# Patient Record
Sex: Male | Born: 1975 | Race: White | Hispanic: No | Marital: Married | Smoking: Never smoker
Health system: Southern US, Community
[De-identification: ages and names within clinical notes are randomized; demographics above are authoritative.]

## PROBLEM LIST (undated history)

## (undated) DIAGNOSIS — I10 Essential (primary) hypertension: Secondary | ICD-10-CM

## (undated) HISTORY — PX: APPENDECTOMY: SHX54

---

## 2019-11-29 ENCOUNTER — Inpatient Hospital Stay (HOSPITAL_COMMUNITY)
Admission: EM | Admit: 2019-11-29 | Discharge: 2019-12-01 | DRG: 190 | Attending: Internal Medicine | Admitting: Internal Medicine

## 2019-11-29 ENCOUNTER — Emergency Department (HOSPITAL_COMMUNITY)

## 2019-11-29 ENCOUNTER — Encounter (HOSPITAL_COMMUNITY): Payer: Self-pay

## 2019-11-29 ENCOUNTER — Other Ambulatory Visit: Payer: Self-pay

## 2019-11-29 DIAGNOSIS — J9601 Acute respiratory failure with hypoxia: Secondary | ICD-10-CM | POA: Diagnosis present

## 2019-11-29 DIAGNOSIS — M549 Dorsalgia, unspecified: Secondary | ICD-10-CM | POA: Diagnosis present

## 2019-11-29 DIAGNOSIS — Z87891 Personal history of nicotine dependence: Secondary | ICD-10-CM | POA: Diagnosis not present

## 2019-11-29 DIAGNOSIS — R21 Rash and other nonspecific skin eruption: Secondary | ICD-10-CM | POA: Diagnosis present

## 2019-11-29 DIAGNOSIS — J441 Chronic obstructive pulmonary disease with (acute) exacerbation: Principal | ICD-10-CM | POA: Diagnosis present

## 2019-11-29 DIAGNOSIS — R0789 Other chest pain: Secondary | ICD-10-CM | POA: Diagnosis present

## 2019-11-29 DIAGNOSIS — R6510 Systemic inflammatory response syndrome (SIRS) of non-infectious origin without acute organ dysfunction: Secondary | ICD-10-CM | POA: Diagnosis present

## 2019-11-29 DIAGNOSIS — E872 Acidosis: Secondary | ICD-10-CM | POA: Diagnosis present

## 2019-11-29 DIAGNOSIS — Z20822 Contact with and (suspected) exposure to covid-19: Secondary | ICD-10-CM | POA: Diagnosis present

## 2019-11-29 DIAGNOSIS — I10 Essential (primary) hypertension: Secondary | ICD-10-CM | POA: Diagnosis present

## 2019-11-29 DIAGNOSIS — R0902 Hypoxemia: Secondary | ICD-10-CM

## 2019-11-29 DIAGNOSIS — G8929 Other chronic pain: Secondary | ICD-10-CM | POA: Diagnosis present

## 2019-11-29 HISTORY — DX: Essential (primary) hypertension: I10

## 2019-11-29 LAB — COMPREHENSIVE METABOLIC PANEL
ALT: 69 U/L — ABNORMAL HIGH (ref 0–44)
AST: 32 U/L (ref 15–41)
Albumin: 4.4 g/dL (ref 3.5–5.0)
Alkaline Phosphatase: 57 U/L (ref 38–126)
Anion gap: 9 (ref 5–15)
BUN: 15 mg/dL (ref 6–20)
CO2: 21 mmol/L — ABNORMAL LOW (ref 22–32)
Calcium: 9.1 mg/dL (ref 8.9–10.3)
Chloride: 106 mmol/L (ref 98–111)
Creatinine, Ser: 1 mg/dL (ref 0.61–1.24)
GFR calc Af Amer: >60 mL/min (ref >60)
GFR calc non Af Amer: >60 mL/min (ref >60)
Glucose, Bld: 134 mg/dL — ABNORMAL HIGH (ref 70–99)
Potassium: 3.5 mmol/L (ref 3.5–5.1)
Sodium: 136 mmol/L (ref 135–145)
Total Bilirubin: 0.9 mg/dL (ref 0.3–1.2)
Total Protein: 7.8 g/dL (ref 6.5–8.1)

## 2019-11-29 LAB — CBC WITH DIFFERENTIAL/PLATELET
Abs Immature Granulocytes: 0.03 10*3/uL (ref 0.00–0.07)
Basophils Absolute: 0 10*3/uL (ref 0.0–0.1)
Basophils Relative: 0 %
Eosinophils Absolute: 0 10*3/uL (ref 0.0–0.5)
Eosinophils Relative: 0 %
HCT: 45.9 % (ref 39.0–52.0)
Hemoglobin: 16.6 g/dL (ref 13.0–17.0)
Immature Granulocytes: 0 %
Lymphocytes Relative: 5 %
Lymphs Abs: 0.5 10*3/uL — ABNORMAL LOW (ref 0.7–4.0)
MCH: 29.6 pg (ref 26.0–34.0)
MCHC: 36.2 g/dL — ABNORMAL HIGH (ref 30.0–36.0)
MCV: 82 fL (ref 80.0–100.0)
Monocytes Absolute: 0.4 10*3/uL (ref 0.1–1.0)
Monocytes Relative: 4 %
Neutro Abs: 8.8 10*3/uL — ABNORMAL HIGH (ref 1.7–7.7)
Neutrophils Relative %: 91 %
Platelets: 214 10*3/uL (ref 150–400)
RBC: 5.6 MIL/uL (ref 4.22–5.81)
RDW: 11.9 % (ref 11.5–15.5)
WBC: 9.6 10*3/uL (ref 4.0–10.5)
nRBC: 0 % (ref 0.0–0.2)

## 2019-11-29 LAB — LACTIC ACID, PLASMA
Lactic Acid, Venous: 3.4 mmol/L (ref 0.5–1.9)
Lactic Acid, Venous: 2.6 mmol/L (ref 0.5–1.9)
Lactic Acid, Venous: 2.1 mmol/L (ref 0.5–1.9)

## 2019-11-29 LAB — TROPONIN I (HIGH SENSITIVITY)
Troponin I (High Sensitivity): 9 ng/L (ref <18)
Troponin I (High Sensitivity): 8 ng/L (ref <18)

## 2019-11-29 LAB — URINALYSIS, ROUTINE W REFLEX MICROSCOPIC
Bilirubin Urine: NEGATIVE
Glucose, UA: NEGATIVE mg/dL
Hgb urine dipstick: NEGATIVE
Ketones, ur: NEGATIVE mg/dL
Leukocytes,Ua: NEGATIVE
Nitrite: NEGATIVE
Protein, ur: NEGATIVE mg/dL
Specific Gravity, Urine: 1.045 — ABNORMAL HIGH (ref 1.005–1.030)
pH: 5 (ref 5.0–8.0)

## 2019-11-29 LAB — PROCALCITONIN: Procalcitonin: 1.63 ng/mL

## 2019-11-29 LAB — PROTIME-INR
INR: 1 (ref 0.8–1.2)
Prothrombin Time: 12.9 seconds (ref 11.4–15.2)

## 2019-11-29 LAB — MRSA PCR SCREENING: MRSA by PCR: NEGATIVE

## 2019-11-29 LAB — BRAIN NATRIURETIC PEPTIDE: B Natriuretic Peptide: 36 pg/mL (ref 0.0–100.0)

## 2019-11-29 LAB — SARS CORONAVIRUS 2 BY RT PCR (HOSPITAL ORDER, PERFORMED IN ~~LOC~~ HOSPITAL LAB): SARS Coronavirus 2: NEGATIVE

## 2019-11-29 LAB — APTT: aPTT: 27 seconds (ref 24–36)

## 2019-11-29 MED ORDER — ACETAMINOPHEN 325 MG PO TABS
650.0000 mg | ORAL_TABLET | Freq: Once | ORAL | Status: AC
  Administered 2019-11-29: 650 mg via ORAL
  Filled 2019-11-29: qty 2

## 2019-11-29 MED ORDER — SODIUM CHLORIDE 0.9 % IV BOLUS
2000.0000 mL | Freq: Once | INTRAVENOUS | Status: AC
  Administered 2019-11-29: 2000 mL via INTRAVENOUS

## 2019-11-29 MED ORDER — FLUTICASONE FUROATE-VILANTEROL 100-25 MCG/INH IN AEPB
1.0000 | INHALATION_SPRAY | Freq: Every day | RESPIRATORY_TRACT | Status: DC
  Administered 2019-11-30 – 2019-12-01 (×2): 1 via RESPIRATORY_TRACT
  Filled 2019-11-29: qty 28

## 2019-11-29 MED ORDER — VANCOMYCIN HCL 2000 MG/400ML IV SOLN
2000.0000 mg | Freq: Once | INTRAVENOUS | Status: AC
  Administered 2019-11-29: 2000 mg via INTRAVENOUS
  Filled 2019-11-29: qty 400

## 2019-11-29 MED ORDER — SODIUM CHLORIDE 0.9 % IV SOLN
2.0000 g | Freq: Three times a day (TID) | INTRAVENOUS | Status: DC
  Administered 2019-11-29 – 2019-12-01 (×5): 2 g via INTRAVENOUS
  Filled 2019-11-29 (×5): qty 2

## 2019-11-29 MED ORDER — ASPIRIN 81 MG PO CHEW
324.0000 mg | CHEWABLE_TABLET | Freq: Once | ORAL | Status: AC
  Administered 2019-11-29: 324 mg via ORAL
  Filled 2019-11-29: qty 4

## 2019-11-29 MED ORDER — ENOXAPARIN SODIUM 40 MG/0.4ML ~~LOC~~ SOLN
40.0000 mg | SUBCUTANEOUS | Status: DC
  Administered 2019-11-29: 40 mg via SUBCUTANEOUS
  Filled 2019-11-29: qty 0.4

## 2019-11-29 MED ORDER — IOHEXOL 350 MG/ML SOLN
100.0000 mL | Freq: Once | INTRAVENOUS | Status: AC | PRN
  Administered 2019-11-29: 100 mL via INTRAVENOUS

## 2019-11-29 MED ORDER — IPRATROPIUM-ALBUTEROL 0.5-2.5 (3) MG/3ML IN SOLN
3.0000 mL | Freq: Four times a day (QID) | RESPIRATORY_TRACT | Status: DC
  Administered 2019-11-29 – 2019-11-30 (×2): 3 mL via RESPIRATORY_TRACT
  Filled 2019-11-29 (×2): qty 3

## 2019-11-29 MED ORDER — METHYLPREDNISOLONE SODIUM SUCC 125 MG IJ SOLR
60.0000 mg | Freq: Four times a day (QID) | INTRAMUSCULAR | Status: DC
  Administered 2019-11-30 (×2): 60 mg via INTRAVENOUS
  Filled 2019-11-29 (×3): qty 2

## 2019-11-29 MED ORDER — ALBUTEROL SULFATE (2.5 MG/3ML) 0.083% IN NEBU: 2.5000 mg | INHALATION_SOLUTION | RESPIRATORY_TRACT | Status: DC | PRN

## 2019-11-29 MED ORDER — SODIUM CHLORIDE 0.9 % IV BOLUS
1000.0000 mL | Freq: Once | INTRAVENOUS | Status: AC
  Administered 2019-11-29: 1000 mL via INTRAVENOUS

## 2019-11-29 MED ORDER — VANCOMYCIN HCL IN DEXTROSE 1-5 GM/200ML-% IV SOLN: 1000.0000 mg | Freq: Once | INTRAVENOUS | Status: DC

## 2019-11-29 MED ORDER — METRONIDAZOLE IN NACL 5-0.79 MG/ML-% IV SOLN
500.0000 mg | Freq: Once | INTRAVENOUS | Status: AC
  Administered 2019-11-29: 500 mg via INTRAVENOUS
  Filled 2019-11-29: qty 100

## 2019-11-29 MED ORDER — SODIUM CHLORIDE 0.9 % IV SOLN
2.0000 g | Freq: Once | INTRAVENOUS | Status: AC
  Administered 2019-11-29: 2 g via INTRAVENOUS
  Filled 2019-11-29: qty 2

## 2019-11-29 MED ORDER — VANCOMYCIN HCL 1250 MG/250ML IV SOLN
1250.0000 mg | Freq: Two times a day (BID) | INTRAVENOUS | Status: DC
  Administered 2019-11-30: 1250 mg via INTRAVENOUS
  Filled 2019-11-29 (×2): qty 250

## 2019-11-29 MED ORDER — PREDNISONE 20 MG PO TABS: 40.0000 mg | ORAL_TABLET | Freq: Every day | ORAL | Status: DC

## 2019-11-29 MED ORDER — METHYLPREDNISOLONE SODIUM SUCC 125 MG IJ SOLR
125.0000 mg | Freq: Once | INTRAMUSCULAR | Status: AC
  Administered 2019-11-29: 125 mg via INTRAVENOUS
  Filled 2019-11-29: qty 2

## 2019-11-29 NOTE — Progress Notes (Signed)
Pharmacy Antibiotic Note  Lance Jacobson is a 44 y.o. male admitted on 11/29/2019 with sepsis.  Pharmacy has been consulted for vancomycin and cefepime  dosing.  Plan: Start cefepime 2g IV q8h Give  vancomycin 2g  IV x1, then start vancomycin 1.25g IV  q12h Goal vancomycin trough range:      15-20   mcg/mL Pharmacy will continue to monitor renal function, vancomycin troughs as clinically appropriate,  cultures and patient progress.  Height: 5\' 5"  (165.1 cm) Weight: 90.7 kg (200 lb) IBW/kg (Calculated) : 61.5  Temp (24hrs), Avg:102.2 F (39 C), Min:102.2 F (39 C), Max:102.2 F (39 C)  No results for input(s): WBC, CREATININE, LATICACIDVEN, VANCOTROUGH, VANCOPEAK, VANCORANDOM, GENTTROUGH, GENTPEAK, GENTRANDOM, TOBRATROUGH, TOBRAPEAK, TOBRARND, AMIKACINPEAK, AMIKACINTROU, AMIKACIN in the last 168 hours.  CrCl cannot be calculated (No successful lab value found.).    Not on File  Antimicrobials this admission: metronidazole 6/19 >>   cefepime 6/19>>   vancomycin 6/19>>   Microbiology results: 6/19 [facility] x2:  6/19  UCx:   6/19 Resp PCR: SARS CoV-2 : 6/19 MRSA PCR:   Thank you for allowing pharmacy to be a part of this patient's care.  7/19 11/29/2019 2:27 PM

## 2019-11-29 NOTE — H&P (Addendum)
History and Physical  Taylan Marez DQQ:[id number] DOB: 1975-07-03 DOA: 11/29/2019  Referring physician: Ralph Leyden, PA-C, EDP PCP: Patient, No Pcp Per  Outpatient Specialists:   Patient Coming From: Jail  Chief Complaint: Fever, chest pain, SOB  HPI: Lance Jacobson is a 44 y.o. male with a history of Hypertension, not on medication, h/o IV drug use, former smoker (quit about 6 weeks ago).  Used to smoke approximately 1 pack a day.  He presents with sudden onset of fever, shortness of breath, chest pain in his left chest that radiated to his left shoulder and arm.  He has a sensation of needing to cough, but has not been able to cough anything up.  He has had a Covid vaccine and no current Covid cases in the facility where he is staying.  Emergency Department Course: Chest x-ray normal, CTA chest normal.  CT abdomen pelvis unremarkable.  White count normal.  Oxygen saturation 88% on arrival and improved to mid 90s on oxygen.  Patient febrile.  Broad-spectrum antibiotics started.  Review of Systems:   Pt denies any fevers, chills, nausea, vomiting, diarrhea, constipation, abdominal pain, shortness of breath, dyspnea on exertion, orthopnea, cough, wheezing, palpitations, headache, vision changes, lightheadedness, dizziness, melena, rectal bleeding.  Review of systems are otherwise negative  Past Medical History:  Diagnosis Date  . Hypertension    Past Surgical History:  Procedure Laterality Date  . APPENDECTOMY     Social History:  reports that he has never smoked. He has never used smokeless tobacco. He reports that he does not drink alcohol. No history on file for drug use. Patient currently in jail  Not on File  History reviewed. No pertinent family history.    Prior to Admission medications   Not on File    Physical Exam: BP 109/74   Pulse 89   Temp 99.2 F (37.3 C) (Oral)   Resp (!) 25   Ht 5\' 5"  (1.651 m)   Wt 90.7 kg   SpO2 98%   BMI 33.28 kg/m    . General: Middle-age male. Awake and alert and oriented x3. No acute cardiopulmonary distress.  HEENT: Normocephalic atraumatic.  Right and left ears normal in appearance.  Pupils equal, round, reactive to light. Extraocular muscles are intact. Sclerae anicteric and noninjected.  Moist mucosal membranes. No mucosal lesions.  . Neck: Neck supple without lymphadenopathy. No carotid bruits. No masses palpated.  . Cardiovascular: Regular rate with normal S1-S2 sounds. No murmurs, rubs, gallops auscultated. No JVD.  Marland Kitchen Respiratory: Diminished breath sounds with rhonchi and diffuse wheezes.  No accessory muscle use. . Abdomen: Soft, nontender, nondistended. Active bowel sounds. No masses or hepatosplenomegaly  . Skin: Mild heat rash on chest and neck.  No other rashes, lesions, or ulcerations.  Dry, warm to touch. 2+ dorsalis pedis and radial pulses. . Musculoskeletal: No calf or leg pain. All major joints not erythematous nontender.  No upper or lower joint deformation.  Good ROM.  No contractures  . Psychiatric: Intact judgment and insight. Pleasant and cooperative. . Neurologic: No focal neurological deficits. Strength is 5/5 and symmetric in upper and lower extremities.  Cranial nerves II through XII are grossly intact.           Labs on Admission: I have personally reviewed following labs and imaging studies  CBC: Recent Labs  Lab 11/29/19 1449  WBC 9.6  NEUTROABS 8.8*  HGB 16.6  HCT 45.9  MCV 82.0  PLT 214   Basic Metabolic Panel:  Recent Labs  Lab 11/29/19 1449  NA 136  K 3.5  CL 106  CO2 21*  GLUCOSE 134*  BUN 15  CREATININE 1.00  CALCIUM 9.1   GFR: Estimated Creatinine Clearance: 100.7 mL/min (by C-G formula based on SCr of 1 mg/dL). Liver Function Tests: Recent Labs  Lab 11/29/19 1449  AST 32  ALT 69*  ALKPHOS 57  BILITOT 0.9  PROT 7.8  ALBUMIN 4.4   No results for input(s): LIPASE, AMYLASE in the last 168 hours. No results for input(s): AMMONIA in the  last 168 hours. Coagulation Profile: Recent Labs  Lab 11/29/19 1449  INR 1.0   Cardiac Enzymes: No results for input(s): CKTOTAL, CKMB, CKMBINDEX, TROPONINI in the last 168 hours. BNP (last 3 results) No results for input(s): PROBNP in the last 8760 hours. HbA1C: No results for input(s): HGBA1C in the last 72 hours. CBG: No results for input(s): GLUCAP in the last 168 hours. Lipid Profile: No results for input(s): CHOL, HDL, LDLCALC, TRIG, CHOLHDL, LDLDIRECT in the last 72 hours. Thyroid Function Tests: No results for input(s): TSH, T4TOTAL, FREET4, T3FREE, THYROIDAB in the last 72 hours. Anemia Panel: No results for input(s): VITAMINB12, FOLATE, FERRITIN, TIBC, IRON, RETICCTPCT in the last 72 hours. Urine analysis:    Component Value Date/Time   COLORURINE YELLOW 11/29/2019 1419   APPEARANCEUR CLEAR 11/29/2019 1419   LABSPEC 1.045 (H) 11/29/2019 1419   PHURINE 5.0 11/29/2019 1419   GLUCOSEU NEGATIVE 11/29/2019 1419   HGBUR NEGATIVE 11/29/2019 1419   BILIRUBINUR NEGATIVE 11/29/2019 1419   KETONESUR NEGATIVE 11/29/2019 1419   PROTEINUR NEGATIVE 11/29/2019 1419   NITRITE NEGATIVE 11/29/2019 1419   LEUKOCYTESUR NEGATIVE 11/29/2019 1419   Sepsis Labs: @LABRCNTIP (procalcitonin:4,lacticidven:4) ) Recent Results (from the past 240 hour(s))  Blood Culture (routine x 2)     Status: None (Preliminary result)   Collection Time: 11/29/19  2:52 PM   Specimen: BLOOD RIGHT HAND  Result Value Ref Range Status   Specimen Description   Final    BLOOD RIGHT HAND BOTTLES DRAWN AEROBIC AND ANAEROBIC   Special Requests   Final    Blood Culture adequate volume Performed at [facility], [address], [place], [place]    Culture PENDING  Incomplete   Report Status PENDING  Incomplete  Blood Culture (routine x 2)     Status: None (Preliminary result)   Collection Time: 11/29/19  2:55 PM   Specimen: BLOOD LEFT HAND  Result Value Ref Range Status   Specimen Description   Final     BLOOD LEFT HAND BOTTLES DRAWN AEROBIC AND ANAEROBIC   Special Requests   Final    Blood Culture adequate volume Performed at [facility], [address] [place], [place]    Culture PENDING  Incomplete   Report Status PENDING  Incomplete  SARS Coronavirus 2 by RT PCR (hospital order, performed in [facility] hospital lab) Nasopharyngeal Nasopharyngeal Swab     Status: None   Collection Time: 11/29/19  3:41 PM   Specimen: Nasopharyngeal Swab  Result Value Ref Range Status   SARS Coronavirus 2 NEGATIVE NEGATIVE Final    Comment: (NOTE) SARS-CoV-2 target nucleic acids are NOT DETECTED.  The SARS-CoV-2 RNA is generally detectable in upper and lower respiratory specimens during the acute phase of infection. The lowest concentration of SARS-CoV-2 viral copies this assay can detect is 250 copies / mL. A negative result does not preclude SARS-CoV-2 infection and should not be used as the sole basis for  treatment or other patient management decisions.  A negative result may occur with improper specimen collection / handling, submission of specimen other than nasopharyngeal swab, presence of viral mutation(s) within the areas targeted by this assay, and inadequate number of viral copies (<250 copies / mL). A negative result must be combined with clinical observations, patient history, and epidemiological information.  Fact Sheet for Patients:   StrictlyIdeas.no  Fact Sheet for Healthcare Providers: BankingDealers.co.za  This test is not yet approved or  cleared by the [place] FDA and has been authorized for detection and/or diagnosis of SARS-CoV-2 by FDA under an Emergency Use Authorization (EUA).  This EUA will remain in effect (meaning this test can be used) for the duration of the COVID-19 declaration under Section 564(b)(1) of the Act, 21 U.S.C. section 360bbb-3(b)(1), unless the authorization is terminated or revoked  sooner.  Performed at [facility], [address], [place], [place] [address]      Radiological Exams on Admission: CT Angio Chest PE W/Cm &/Or Wo Cm  Result Date: 11/29/2019 CLINICAL DATA:  Shortness of breath and left-sided chest pain. EXAM: CT ANGIOGRAPHY CHEST WITH CONTRAST TECHNIQUE: Multidetector CT imaging of the chest was performed using the standard protocol during bolus administration of intravenous contrast. Multiplanar CT image reconstructions and MIPs were obtained to evaluate the vascular anatomy. CONTRAST:  168mL OMNIPAQUE IOHEXOL 350 MG/ML SOLN COMPARISON:  Chest x-ray dated 11/29/2019 FINDINGS: Cardiovascular: Satisfactory opacification of the pulmonary arteries to the segmental level. No evidence of pulmonary embolism. Normal heart size. No pericardial effusion. Mediastinum/Nodes: No enlarged mediastinal, hilar, or axillary lymph nodes. Thyroid gland, trachea, and esophagus demonstrate no significant findings. Lungs/Pleura: Lungs are clear. No pleural effusion or pneumothorax. Upper Abdomen: No acute abnormality. Musculoskeletal: No chest wall abnormality. No acute or significant osseous findings. Review of the MIP images confirms the above findings. IMPRESSION: Normal exam. Electronically Signed   By: Lorriane Shire M.D.   On: 11/29/2019 16:25   CT ABDOMEN PELVIS W CONTRAST  Result Date: 11/29/2019 CLINICAL DATA:  Abdominal pain and fever. EXAM: CT ABDOMEN AND PELVIS WITH CONTRAST TECHNIQUE: Multidetector CT imaging of the abdomen and pelvis was performed using the standard protocol following bolus administration of intravenous contrast. CONTRAST:  152mL OMNIPAQUE IOHEXOL 350 MG/ML SOLN COMPARISON:  None. FINDINGS: Lower chest: Normal. Hepatobiliary: Hepatic steatosis. Biliary tree is normal including the gallbladder. Pancreas: Unremarkable. No pancreatic ductal dilatation or surrounding inflammatory changes. Spleen: Normal in size without focal abnormality. Adrenals/Urinary Tract:  Adrenal glands are unremarkable. Kidneys are normal, without renal calculi, focal lesion, or hydronephrosis. Bladder is unremarkable. Stomach/Bowel: Stomach is within normal limits. Appendix has been removed. No evidence of bowel wall thickening, distention, or inflammatory changes. Vascular/Lymphatic: No significant vascular findings are present. No enlarged abdominal or pelvic lymph nodes. Reproductive: Prostate is unremarkable. Other: No abdominal wall hernia or abnormality. No abdominopelvic ascites. Musculoskeletal: No acute abnormality. Bilateral pars defects at L5. No spondylolisthesis at this time. Otherwise normal. IMPRESSION: 1. No acute abnormalities of the abdomen or pelvis. 2. Hepatic steatosis. 3. Bilateral pars defects at L5. Electronically Signed   By: Lorriane Shire M.D.   On: 11/29/2019 16:29   DG Chest Port 1 View  Result Date: 11/29/2019 CLINICAL DATA:  Patient went to eat today and came back and started having pain in left arm, left shoulder, and left chest. He also has cramping in left leg. Pt has a history of HTN, Patient was warm to touch and appeared diaphoretic. EXAM: PORTABLE CHEST - 1 VIEW  COMPARISON:  none FINDINGS: Relatively low lung volumes with some crowding bronchovascular structures at the lung bases. No focal airspace consolidation or overt edema. Heart size and mediastinal contours are within normal limits. No effusion.  No pneumothorax. Visualized bones unremarkable. IMPRESSION: Low volumes.  No acute disease. Electronically Signed   By: Corlis Leak M.D.   On: 11/29/2019 14:56    EKG: Independently reviewed.  Sinus tachycardia with abnormal R wave progression.  No acute ST changes  Assessment/Plan: Active Problems:   Acute respiratory failure with hypoxia (HCC)   COPD with acute exacerbation (HCC)   SIRS (systemic inflammatory response syndrome) (HCC)    This patient was discussed with the ED physician, including pertinent vitals, physical exam findings, labs, and  imaging.  We also discussed care given by the ED provider.  1. Acute resp failure with hypoxia a. Oxygen support b. Continuous pulse ox c. No obvious source of infection 2. COPD Antibiotics: Cefepime, Vanc DuoNeb's every 6 scheduled with albuterol every 2 when necessary Continue inhaled steroids and LA bronchodilator Solu-Medrol 60 mg IV every 6 hours Mucinex 3. SIRS a. No bacterial source yet b. Broad spectrum antibiotics c. Lactic acidosis improving d. Procalcitonin elevated - continue antibiotics e. Will narrow with improvement of clinical scenario and dependent upon procalcitonin  DVT prophylaxis: lovenox Consultants: none Code Status: full Family Communication: none  Disposition Plan: return to jail following improvement.   Levie Heritage, DO

## 2019-11-29 NOTE — ED Notes (Signed)
Pt. Attempted to stand to use restroom. Pt. Was dizzy and gait unsteady. Pt. Sat back down in bed and used bedside urinal instead.

## 2019-11-29 NOTE — ED Triage Notes (Signed)
Pt went to eat today and came back and started having pain in left arm, left shoulder, and left chest. He also has cramping in left leg. Pt has a history of HTN, but does not take any medication. VSS with EMS.

## 2019-11-29 NOTE — ED Notes (Signed)
ED TO INPATIENT HANDOFF REPORT  ED Nurse Name and Phone #:   S Name/Age/Gender Lance Jacobson 44 y.o. male Room/Bed: APA06/APA06  Code Status   Code Status: Full Code  Home/SNF/Other [facility] Patient oriented to: self, place, time and situation Is this baseline? Yes   Triage Complete: Triage complete  Chief Complaint Acute respiratory failure with hypoxia (HCC) [J96.01]  Triage Note Pt went to eat today and came back and started having pain in left arm, left shoulder, and left chest. He also has cramping in left leg. Pt has a history of HTN, but does not take any medication. VSS with EMS.     Allergies Not on File  Level of Care/Admitting Diagnosis ED Disposition    ED Disposition Condition Comment   Admit  Hospital Area: [facility] [[id number]]  Level of Care: Med-Surg [16]  Covid Evaluation: Asymptomatic Screening Protocol (No Symptoms)  Admission Type: Emergency [1]  Diagnosis: Acute respiratory failure with hypoxia [facility]) [[id number]]  Admitting Physician: Levie Heritage [[id number]]  Attending Physician: Levie Heritage [[id number]]  Estimated length of stay: 3 - 4 days  Certification:: I certify this patient will need inpatient services for at least 2 midnights       B Medical/Surgery History Past Medical History:  Diagnosis Date  . Hypertension    Past Surgical History:  Procedure Laterality Date  . APPENDECTOMY       A IV Location/Drains/Wounds Patient Lines/Drains/Airways Status    Active Line/Drains/Airways    Name Placement date Placement time Site Days   Peripheral IV 11/29/19 Left Hand 11/29/19  1458  Hand  less than 1   Peripheral IV 11/29/19 Right Hand 11/29/19  1458  Hand  less than 1   Peripheral IV 11/29/19 Right Antecubital 11/29/19  1507  Antecubital  less than 1          Intake/Output Last 24 hours  Intake/Output Summary (Last 24 hours) at 11/29/2019 1940 Last data filed at 11/29/2019 1924 Gross per 24 hour  Intake 3600 ml  Output  --  Net 3600 ml    Labs/Imaging Results for orders placed or performed during the hospital encounter of 11/29/19 (from the past 48 hour(s))  Urinalysis, Routine w reflex microscopic     Status: Abnormal   Collection Time: 11/29/19  2:19 PM  Result Value Ref Range   Color, Urine YELLOW YELLOW   APPearance CLEAR CLEAR   Specific Gravity, Urine 1.045 (H) 1.005 - 1.030   pH 5.0 5.0 - 8.0   Glucose, UA NEGATIVE NEGATIVE mg/dL   Hgb urine dipstick NEGATIVE NEGATIVE   Bilirubin Urine NEGATIVE NEGATIVE   Ketones, ur NEGATIVE NEGATIVE mg/dL   Protein, ur NEGATIVE NEGATIVE mg/dL   Nitrite NEGATIVE NEGATIVE   Leukocytes,Ua NEGATIVE NEGATIVE    Comment: Performed at [facility], [address] [place], [place] [address]  Comprehensive metabolic panel     Status: Abnormal   Collection Time: 11/29/19  2:49 PM  Result Value Ref Range   Sodium 136 135 - 145 mmol/L   Potassium 3.5 3.5 - 5.1 mmol/L   Chloride 106 98 - 111 mmol/L   CO2 21 (L) 22 - 32 mmol/L   Glucose, Bld 134 (H) 70 - 99 mg/dL    Comment: Glucose reference range applies only to samples taken after fasting for at least 8 hours.   BUN 15 6 - 20 mg/dL   Creatinine, Ser 2.13 0.61 - 1.24 mg/dL   Calcium 9.1 8.9 - 08.6 mg/dL  Total Protein 7.8 6.5 - 8.1 g/dL   Albumin 4.4 3.5 - 5.0 g/dL   AST 32 15 - 41 U/L   ALT 69 (H) 0 - 44 U/L   Alkaline Phosphatase 57 38 - 126 U/L   Total Bilirubin 0.9 0.3 - 1.2 mg/dL   GFR calc non Af Amer >60 >60 mL/min   GFR calc Af Amer >60 >60 mL/min   Anion gap 9 5 - 15    Comment: Performed at [facility], [address], [place], [place] [address]  CBC WITH DIFFERENTIAL     Status: Abnormal   Collection Time: 11/29/19  2:49 PM  Result Value Ref Range   WBC 9.6 4.0 - 10.5 K/uL   RBC 5.60 4.22 - 5.81 MIL/uL   Hemoglobin 16.6 13.0 - 17.0 g/dL   HCT 79.0 39 - 52 %   MCV 82.0 80.0 - 100.0 fL   MCH 29.6 26.0 - 34.0 pg   MCHC 36.2 (H) 30.0 - 36.0 g/dL   RDW 24.0 97.3 - 53.2 %   Platelets 214 150  - 400 K/uL   nRBC 0.0 0.0 - 0.2 %   Neutrophils Relative % 91 %   Neutro Abs 8.8 (H) 1.7 - 7.7 K/uL   Lymphocytes Relative 5 %   Lymphs Abs 0.5 (L) 0.7 - 4.0 K/uL   Monocytes Relative 4 %   Monocytes Absolute 0.4 0 - 1 K/uL   Eosinophils Relative 0 %   Eosinophils Absolute 0.0 0 - 0 K/uL   Basophils Relative 0 %   Basophils Absolute 0.0 0 - 0 K/uL   Immature Granulocytes 0 %   Abs Immature Granulocytes 0.03 0.00 - 0.07 K/uL    Comment: Performed at [facility], [address], [place], [place] [address]  APTT     Status: None   Collection Time: 11/29/19  2:49 PM  Result Value Ref Range   aPTT 27 24 - 36 seconds    Comment: Performed at [facility], [address], [place], [place] [address]  Protime-INR     Status: None   Collection Time: 11/29/19  2:49 PM  Result Value Ref Range   Prothrombin Time 12.9 11.4 - 15.2 seconds   INR 1.0 0.8 - 1.2    Comment: (NOTE) INR goal varies based on device and disease states. Performed at [facility], [address] [place], [place] [address]   Procalcitonin     Status: None   Collection Time: 11/29/19  2:49 PM  Result Value Ref Range   Procalcitonin 1.63 ng/mL    Comment:        Interpretation: PCT > 0.5 ng/mL and <= 2 ng/mL: Systemic infection (sepsis) is possible, but other conditions are known to elevate PCT as well. (NOTE)       Sepsis PCT Algorithm           Lower Respiratory Tract                                      Infection PCT Algorithm    ----------------------------     ----------------------------         PCT < 0.25 ng/mL                PCT < 0.10 ng/mL          Strongly encourage  Strongly discourage   discontinuation of antibiotics    initiation of antibiotics    ----------------------------     -----------------------------       PCT 0.25 - 0.50 ng/mL            PCT 0.10 - 0.25 ng/mL               OR       >80% decrease in PCT            Discourage initiation of                                             antibiotics      Encourage discontinuation           of antibiotics    ----------------------------     -----------------------------         PCT >= 0.50 ng/mL              PCT 0.26 - 0.50 ng/mL                AND       <80% decrease in PCT             Encourage initiation of                                             antibiotics       Encourage continuation           of antibiotics    ----------------------------     -----------------------------        PCT >= 0.50 ng/mL                  PCT > 0.50 ng/mL               AND         increase in PCT                  Strongly encourage                                      initiation of antibiotics    Strongly encourage escalation           of antibiotics                                     -----------------------------                                           PCT <= 0.25 ng/mL                                                 OR                                        >  80% decrease in PCT                                      Discontinue / Do not initiate                                             antibiotics  Performed at [facility], [address], [place], [place] [address]   Troponin I (High Sensitivity)     Status: None   Collection Time: 11/29/19  2:49 PM  Result Value Ref Range   Troponin I (High Sensitivity) 9 <18 ng/L    Comment: (NOTE) Elevated high sensitivity troponin I (hsTnI) values and significant  changes across serial measurements may suggest ACS but many other  chronic and acute conditions are known to elevate hsTnI results.  Refer to the "Links" section for chest pain algorithms and additional  guidance. Performed at [facility], [address], [place], [place] [address]   Blood Culture (routine x 2)     Status: None (Preliminary result)   Collection Time: 11/29/19  2:52 PM   Specimen: BLOOD RIGHT HAND  Result Value Ref Range   Specimen Description      BLOOD RIGHT HAND BOTTLES DRAWN AEROBIC AND  ANAEROBIC   Special Requests      Blood Culture adequate volume Performed at [facility], [address], [place], [place] [address]    Culture PENDING    Report Status PENDING   Lactic acid, plasma     Status: Abnormal   Collection Time: 11/29/19  2:54 PM  Result Value Ref Range   Lactic Acid, Venous 3.4 (HH) 0.5 - 1.9 mmol/L    Comment: CRITICAL RESULT CALLED TO, READ BACK BY AND VERIFIED WITH: HOLCULM R. @ 1518 ON [id number] BY HENDERSON L. Performed at [facility], [address], [place], [place] [address]   Blood Culture (routine x 2)     Status: None (Preliminary result)   Collection Time: 11/29/19  2:55 PM   Specimen: BLOOD LEFT HAND  Result Value Ref Range   Specimen Description      BLOOD LEFT HAND BOTTLES DRAWN AEROBIC AND ANAEROBIC   Special Requests      Blood Culture adequate volume Performed at [facility], [address] [place], [place] [address]    Culture PENDING    Report Status PENDING   Brain natriuretic peptide     Status: None   Collection Time: 11/29/19  2:55 PM  Result Value Ref Range   B Natriuretic Peptide 36.0 0.0 - 100.0 pg/mL    Comment: Performed at [facility], [address], [place], [place] [address]  SARS Coronavirus 2 by RT PCR (hospital order, performed in [facility] hospital lab) Nasopharyngeal Nasopharyngeal Swab     Status: None   Collection Time: 11/29/19  3:41 PM   Specimen: Nasopharyngeal Swab  Result Value Ref Range   SARS Coronavirus 2 NEGATIVE NEGATIVE    Comment: (NOTE) SARS-CoV-2 target nucleic acids are NOT DETECTED.  The SARS-CoV-2 RNA is generally detectable in upper and lower respiratory specimens during the acute phase of infection. The lowest concentration of SARS-CoV-2 viral copies this assay can detect is 250 copies / mL. A negative result does not preclude SARS-CoV-2 infection and should not be used as the  sole basis for treatment or other patient management decisions.  A negative result may occur with improper  specimen collection / handling, submission of specimen other than nasopharyngeal swab, presence of viral mutation(s) within the areas targeted by this assay, and inadequate number of viral copies (<250 copies / mL). A negative result must be combined with clinical observations, patient history, and epidemiological information.  Fact Sheet for Patients:   BoilerBrush.com.cy  Fact Sheet for Healthcare Providers: https://pope.com/  This test is not yet approved or  cleared by the [place] FDA and has been authorized for detection and/or diagnosis of SARS-CoV-2 by FDA under an Emergency Use Authorization (EUA).  This EUA will remain in effect (meaning this test can be used) for the duration of the COVID-19 declaration under Section 564(b)(1) of the Act, 21 U.S.C. section 360bbb-3(b)(1), unless the authorization is terminated or revoked sooner.  Performed at [facility], [address] [place], [place] [address]   Lactic acid, plasma     Status: Abnormal   Collection Time: 11/29/19  5:00 PM  Result Value Ref Range   Lactic Acid, Venous 2.6 (HH) 0.5 - 1.9 mmol/L    Comment: CRITICAL VALUE NOTED.  VALUE IS CONSISTENT WITH PREVIOUSLY REPORTED AND CALLED VALUE. Performed at [facility], [address], [place], [place] [address]   Troponin I (High Sensitivity)     Status: None   Collection Time: 11/29/19  5:00 PM  Result Value Ref Range   Troponin I (High Sensitivity) 8 <18 ng/L    Comment: (NOTE) Elevated high sensitivity troponin I (hsTnI) values and significant  changes across serial measurements may suggest ACS but many other  chronic and acute conditions are known to elevate hsTnI results.  Refer to the "Links" section for chest pain algorithms and additional  guidance. Performed at [facility], [address], [place], [place] [address]    CT Angio Chest PE W/Cm &/Or Wo Cm  Result Date: 11/29/2019 CLINICAL DATA:  Shortness  of breath and left-sided chest pain. EXAM: CT ANGIOGRAPHY CHEST WITH CONTRAST TECHNIQUE: Multidetector CT imaging of the chest was performed using the standard protocol during bolus administration of intravenous contrast. Multiplanar CT image reconstructions and MIPs were obtained to evaluate the vascular anatomy. CONTRAST:  OMNIPAQUE IOHEXOL 350 MG/ML SOLN COMPARISON:  Chest x-ray dated 11/29/2019 FINDINGS: Cardiovascular: Satisfactory opacification of the pulmonary arteries to the segmental level. No evidence of pulmonary embolism. Normal heart size. No pericardial effusion. Mediastinum/Nodes: No enlarged mediastinal, hilar, or axillary lymph nodes. Thyroid gland, trachea, and esophagus demonstrate no significant findings. Lungs/Pleura: Lungs are clear. No pleural effusion or pneumothorax. Upper Abdomen: No acute abnormality. Musculoskeletal: No chest wall abnormality. No acute or significant osseous findings. Review of the MIP images confirms the above findings. IMPRESSION: Normal exam. Electronically Signed   By: Francene Boyers M.D.   On: 11/29/2019 16:25   CT ABDOMEN PELVIS W CONTRAST  Result Date: 11/29/2019 CLINICAL DATA:  Abdominal pain and fever. EXAM: CT ABDOMEN AND PELVIS WITH CONTRAST TECHNIQUE: Multidetector CT imaging of the abdomen and pelvis was performed using the standard protocol following bolus administration of intravenous contrast. CONTRAST:  OMNIPAQUE IOHEXOL 350 MG/ML SOLN COMPARISON:  None. FINDINGS: Lower chest: Normal. Hepatobiliary: Hepatic steatosis. Biliary tree is normal including the gallbladder. Pancreas: Unremarkable. No pancreatic ductal dilatation or surrounding inflammatory changes. Spleen: Normal in size without focal abnormality. Adrenals/Urinary Tract: Adrenal glands are unremarkable. Kidneys are normal, without renal calculi, focal lesion, or hydronephrosis. Bladder is unremarkable. Stomach/Bowel: Stomach is  within normal limits. Appendix has been removed. No  evidence of bowel wall thickening, distention, or inflammatory changes. Vascular/Lymphatic: No significant vascular findings are present. No enlarged abdominal or pelvic lymph nodes. Reproductive: Prostate is unremarkable. Other: No abdominal wall hernia or abnormality. No abdominopelvic ascites. Musculoskeletal: No acute abnormality. Bilateral pars defects at L5. No spondylolisthesis at this time. Otherwise normal. IMPRESSION: 1. No acute abnormalities of the abdomen or pelvis. 2. Hepatic steatosis. 3. Bilateral pars defects at L5. Electronically Signed   By: Francene BoyersJames  Maxwell M.D.   On: 11/29/2019 16:29   DG Chest Port 1 View  Result Date: 11/29/2019 CLINICAL DATA:  Patient went to eat today and came back and started having pain in left arm, left shoulder, and left chest. He also has cramping in left leg. Pt has a history of HTN, Patient was warm to touch and appeared diaphoretic. EXAM: PORTABLE CHEST - 1 VIEW COMPARISON:  none FINDINGS: Relatively low lung volumes with some crowding bronchovascular structures at the lung bases. No focal airspace consolidation or overt edema. Heart size and mediastinal contours are within normal limits. No effusion.  No pneumothorax. Visualized bones unremarkable. IMPRESSION: Low volumes.  No acute disease. Electronically Signed   By: Corlis Leak  Hassell M.D.   On: 11/29/2019 14:56    Pending Labs Unresulted Labs (From admission, onward) Comment          Start     Ordered   12/01/19 0500  Basic metabolic panel  Every Mon-Wed-Fri (0500),   R      11/29/19 1423   11/30/19 0500  HIV Antibody (routine testing w rflx)  (HIV Antibody (Routine testing w reflex) panel)  Tomorrow morning,   R        11/29/19 1931   11/30/19 0500  Basic metabolic panel  Tomorrow morning,   R        11/29/19 1931   11/30/19 0500  CBC  Tomorrow morning,   R        11/29/19 1931   11/29/19 2000  Lactic acid, plasma  Once,   STAT        11/29/19 1817   11/29/19 1843  Respiratory Panel by PCR   (Respiratory virus panel with precautions)  Once,   R        11/29/19 1842   11/29/19 1419  Urine culture  ONCE - STAT,   STAT        11/29/19 1421          Vitals/Pain Today's Vitals   11/29/19 1758 11/29/19 1829 11/29/19 1859 11/29/19 1929  BP: 113/74 95/82 109/74 106/64  Pulse: 88 94 89 89  Resp: (!) 29 (!) 25    Temp:      TempSrc:      SpO2: 97% 98% 98% 99%  Weight:      Height:      PainSc:        Isolation Precautions Droplet precaution  Medications Medications  vancomycin (VANCOREADY) IVPB 1250 mg/250 mL (has no administration in time range)  ceFEPIme (MAXIPIME) 2 g in sodium chloride 0.9 % 100 mL IVPB (has no administration in time range)  enoxaparin (LOVENOX) injection 40 mg (has no administration in time range)  methylPREDNISolone sodium succinate (SOLU-MEDROL) 125 mg/2 mL injection 60 mg (has no administration in time range)    Followed by  predniSONE (DELTASONE) tablet 40 mg (has no administration in time range)  fluticasone furoate-vilanterol (BREO ELLIPTA) 100-25 MCG/INH 1 puff (has no administration in time range)  ipratropium-albuterol (DUONEB) 0.5-2.5 (3) MG/3ML nebulizer solution 3 mL (has no administration in time range)  albuterol (PROVENTIL) (2.5 MG/3ML) 0.083% nebulizer solution 2.5 mg (has no administration in time range)  ceFEPIme (MAXIPIME) 2 g in sodium chloride 0.9 % 100 mL IVPB (0 g Intravenous Stopped 11/29/19 1545)  metroNIDAZOLE (FLAGYL) IVPB 500 mg (0 mg Intravenous Stopped 11/29/19 1633)  sodium chloride 0.9 % bolus 1,000 mL (0 mLs Intravenous Stopped 11/29/19 1706)  vancomycin (VANCOREADY) IVPB 2000 mg/400 mL (0 mg Intravenous Stopped 11/29/19 1831)  acetaminophen (TYLENOL) tablet 650 mg (650 mg Oral Given 11/29/19 1330)  aspirin chewable tablet 324 mg (324 mg Oral Given 11/29/19 1501)  sodium chloride 0.9 % bolus 2,000 mL (0 mLs Intravenous Stopped 11/29/19 1924)  iohexol (OMNIPAQUE) 350 MG/ML injection 100 mL (100 mLs Intravenous Contrast  Given 11/29/19 1603)  methylPREDNISolone sodium succinate (SOLU-MEDROL) 125 mg/2 mL injection 125 mg (125 mg Intravenous Given 11/29/19 1933)    Mobility manual wheelchair     Focused Assessments    R Recommendations: See Admitting Provider Note  Report given to:   Additional Notes:

## 2019-11-29 NOTE — ED Provider Notes (Signed)
[address] [facility] EMERGENCY DEPARTMENT Provider Note   CSN: [id number] Arrival date & time: 11/29/19  1326    History Fever   Lance Jacobson is a 44 y.o. male with past medical history significant for hypertension, IV drug use who presents for evaluation of fever.  Patient currently incarcerated.  Patient states after eating earlier today he started having pain in his left arm, left chest.  Feels like he has cramping to his left leg.  Not on any current medications.  Has chronic back pain.  Feels like he needs to cough however is not coughing anything up.  He received his Covid vaccines.  Was feeling well prior to symptoms starting.  Denies headache, lightheadedness, dizziness, abdominal pain, diarrhea, dysuria, lesions.  He does have a rash to his anterior chest which she states he gets when he gets "hot."  No rash to mucous membranes, palms or soles.  No recent tick bites.  Denies additional aggravating or relieving factors.  Per police no known Covid in facility.  No prior history of epidural abscesses, endocarditis.  History obtained from patient and past medical records.  No interpreter is used.  HPI     Past Medical History:  Diagnosis Date  . Hypertension     Patient Active Problem List   Diagnosis Date Noted  . Acute respiratory failure with hypoxia (HCC) 11/29/2019  . COPD with acute exacerbation (HCC) 11/29/2019  . SIRS (systemic inflammatory response syndrome) (HCC) 11/29/2019    History reviewed.   History reviewed. No pertinent family history.  Social History   Tobacco Use  . Smoking status: Never Smoker  . Smokeless tobacco: Never Used  Substance Use Topics  . Alcohol use: Never  . Drug use: Not on file    Home Medications Prior to Admission medications   Not on File    Allergies    Patient has no known allergies.  Review of Systems   Review of Systems  Constitutional: Positive for fatigue and fever.  HENT: Negative.   Respiratory: Positive for cough  and shortness of breath.   Cardiovascular: Positive for chest pain. Negative for palpitations and leg swelling.  Gastrointestinal: Negative.   Genitourinary: Negative.   Musculoskeletal: Positive for back pain (Chronic).  Skin: Positive for rash.  Neurological: Positive for weakness (Generalized). Negative for dizziness, tremors, seizures, syncope, facial asymmetry, speech difficulty, light-headedness, numbness and headaches.  All other systems reviewed and are negative.   Physical Exam Updated Vital Signs BP 107/69 (BP Location: Left Arm)   Pulse 85   Temp 99.1 F (37.3 C) (Oral)   Resp (!) 25   Ht 5\' 5"  (1.651 m)   Wt 90.7 kg   SpO2 100%   BMI 33.28 kg/m   Physical Exam Vitals and nursing note reviewed.  Constitutional:      General: He is not in acute distress.    Appearance: He is well-developed. He is ill-appearing. He is not diaphoretic.  HENT:     Head: Normocephalic and atraumatic.     Jaw: There is normal jaw occlusion.     Nose: Nose normal.     Mouth/Throat:     Lips: Pink.     Mouth: Mucous membranes are dry.     Pharynx: Oropharynx is clear. Uvula midline.     Comments: Dry mucous membranes.  No intraoral lesions Eyes:     Pupils: Pupils are equal, round, and reactive to light.  Neck:     Trachea: Trachea and phonation normal.  Meningeal: Brudzinski's sign and Kernig's sign absent.     Comments: No neck stiffness or neck rigidity.  No meningismus Cardiovascular:     Rate and Rhythm: Regular rhythm. Tachycardia present.     Pulses: Normal pulses.          Radial pulses are 2+ on the right side and 2+ on the left side.       Dorsalis pedis pulses are 2+ on the right side and 2+ on the left side.     Heart sounds: Normal heart sounds.  Pulmonary:     Effort: No respiratory distress.     Breath sounds: Rhonchi (Right lower lobe) present.     Comments: Tachypneic, 2 L via nasal cannula.  Rhonchi to right lower lobe.  Speaks in full sentences without  difficulty Chest:     Comments: Equal rise and fall of chest.  No crepitus or step-offs Abdominal:     General: There is no distension.     Palpations: Abdomen is soft.     Comments: Soft, nontender without rebound or guarding.  Normoactive bowel sounds  Musculoskeletal:        General: Normal range of motion.     Cervical back: Full passive range of motion without pain, normal range of motion and neck supple.     Comments: Moves all 4 extremities without difficulty.  Compartments soft.  Mild tenderness to left calf however no overlying skin changes  Skin:    General: Skin is warm and dry.     Capillary Refill: Capillary refill takes less than 2 seconds.     Comments: No edema, warmth.  No fluctuance duration. Erythematous rash to anterior chest wall.  No purpura, petechiae, rash to palms or soles, bulla, target lesions  Neurological:     General: No focal deficit present.     Mental Status: He is alert.     Cranial Nerves: Cranial nerves are intact.     Sensory: Sensation is intact.     Motor: Motor function is intact.     Coordination: Coordination is intact.     Gait: Gait is intact.     Comments: Cranial nerves II through XII grossly intact.  Moves all 4 extremities at difficulty 5/5 strength to bilateral upper and lower extremities at difficulty     ED Results / Procedures / Treatments   Labs (all labs ordered are listed, but only abnormal results are displayed) Labs Reviewed  LACTIC ACID, PLASMA - Abnormal; Notable for the following components:      Result Value   Lactic Acid, Venous 3.4 (*)    All other components within normal limits  LACTIC ACID, PLASMA - Abnormal; Notable for the following components:   Lactic Acid, Venous 2.6 (*)    All other components within normal limits  COMPREHENSIVE METABOLIC PANEL - Abnormal; Notable for the following components:   CO2 21 (*)    Glucose, Bld 134 (*)    ALT 69 (*)    All other components within normal limits  CBC WITH  DIFFERENTIAL/PLATELET - Abnormal; Notable for the following components:   MCHC 36.2 (*)    Neutro Abs 8.8 (*)    Lymphs Abs 0.5 (*)    All other components within normal limits  URINALYSIS, ROUTINE W REFLEX MICROSCOPIC - Abnormal; Notable for the following components:   Specific Gravity, Urine 1.045 (*)    All other components within normal limits  LACTIC ACID, PLASMA - Abnormal; Notable for the following components:  Lactic Acid, Venous 2.1 (*)    All other components within normal limits  CULTURE, BLOOD (ROUTINE X 2)  CULTURE, BLOOD (ROUTINE X 2)  SARS CORONAVIRUS 2 BY RT PCR (HOSPITAL ORDER, PERFORMED IN [facility])  URINE CULTURE  RESPIRATORY PANEL BY PCR  MRSA PCR SCREENING  APTT  PROTIME-INR  BRAIN NATRIURETIC PEPTIDE  PROCALCITONIN  HIV ANTIBODY (ROUTINE TESTING W REFLEX)  BASIC METABOLIC PANEL  CBC  PROCALCITONIN  TROPONIN I (HIGH SENSITIVITY)  TROPONIN I (HIGH SENSITIVITY)    EKG EKG Interpretation  Date/Time:  Saturday November 29 2019 13:35:36 EDT Ventricular Rate:  116 PR Interval:    QRS Duration: 77 QT Interval:  280 QTC Calculation: 389 R Axis:   22 Text Interpretation: Sinus tachycardia Abnormal R-wave progression, late transition Borderline ST elevation, anterolateral leads No STEMI Confirmed by Alvester Chourifan, Matthew [phone]([id number]) on 11/29/2019 2:33:28 PM   Radiology CT Angio Chest PE W/Cm &/Or Wo Cm  Result Date: 11/29/2019 CLINICAL DATA:  Shortness of breath and left-sided chest pain. EXAM: CT ANGIOGRAPHY CHEST WITH CONTRAST TECHNIQUE: Multidetector CT imaging of the chest was performed using the standard protocol during bolus administration of intravenous contrast. Multiplanar CT image reconstructions and MIPs were obtained to evaluate the vascular anatomy. CONTRAST:  100mL OMNIPAQUE IOHEXOL 350 MG/ML SOLN COMPARISON:  Chest x-ray dated 11/29/2019 FINDINGS: Cardiovascular: Satisfactory opacification of the pulmonary arteries to the segmental level. No  evidence of pulmonary embolism. Normal heart size. No pericardial effusion. Mediastinum/Nodes: No enlarged mediastinal, hilar, or axillary lymph nodes. Thyroid gland, trachea, and esophagus demonstrate no significant findings. Lungs/Pleura: Lungs are clear. No pleural effusion or pneumothorax. Upper Abdomen: No acute abnormality. Musculoskeletal: No chest wall abnormality. No acute or significant osseous findings. Review of the MIP images confirms the above findings. IMPRESSION: Normal exam. Electronically Signed   By: Francene BoyersJames  Maxwell M.D.   On: 11/29/2019 16:25   CT ABDOMEN PELVIS W CONTRAST  Result Date: 11/29/2019 CLINICAL DATA:  Abdominal pain and fever. EXAM: CT ABDOMEN AND PELVIS WITH CONTRAST TECHNIQUE: Multidetector CT imaging of the abdomen and pelvis was performed using the standard protocol following bolus administration of intravenous contrast. CONTRAST:  100mL OMNIPAQUE IOHEXOL 350 MG/ML SOLN COMPARISON:  None. FINDINGS: Lower chest: Normal. Hepatobiliary: Hepatic steatosis. Biliary tree is normal including the gallbladder. Pancreas: Unremarkable. No pancreatic ductal dilatation or surrounding inflammatory changes. Spleen: Normal in size without focal abnormality. Adrenals/Urinary Tract: Adrenal glands are unremarkable. Kidneys are normal, without renal calculi, focal lesion, or hydronephrosis. Bladder is unremarkable. Stomach/Bowel: Stomach is within normal limits. Appendix has been removed. No evidence of bowel wall thickening, distention, or inflammatory changes. Vascular/Lymphatic: No significant vascular findings are present. No enlarged abdominal or pelvic lymph nodes. Reproductive: Prostate is unremarkable. Other: No abdominal wall hernia or abnormality. No abdominopelvic ascites. Musculoskeletal: No acute abnormality. Bilateral pars defects at L5. No spondylolisthesis at this time. Otherwise normal. IMPRESSION: 1. No acute abnormalities of the abdomen or pelvis. 2. Hepatic steatosis. 3.  Bilateral pars defects at L5. Electronically Signed   By: Francene BoyersJames  Maxwell M.D.   On: 11/29/2019 16:29   DG Chest Port 1 View  Result Date: 11/29/2019 CLINICAL DATA:  Patient went to eat today and came back and started having pain in left arm, left shoulder, and left chest. He also has cramping in left leg. Pt has a history of HTN, Patient was warm to touch and appeared diaphoretic. EXAM: PORTABLE CHEST - 1 VIEW COMPARISON:  none FINDINGS: Relatively low lung volumes with some crowding bronchovascular  structures at the lung bases. No focal airspace consolidation or overt edema. Heart size and mediastinal contours are within normal limits. No effusion.  No pneumothorax. Visualized bones unremarkable. IMPRESSION: Low volumes.  No acute disease. Electronically Signed   By: Corlis Leak M.D.   On: 11/29/2019 14:56    Procedures .Critical Care Performed by: Linwood Dibbles, PA-C Authorized by: Linwood Dibbles, PA-C   Critical care provider statement:    Critical care time (minutes):  45   Critical care was necessary to treat or prevent imminent or life-threatening deterioration of the following conditions:  Sepsis   Critical care was time spent personally by me on the following activities:  Discussions with consultants, evaluation of patient's response to treatment, examination of patient, ordering and performing treatments and interventions, ordering and review of laboratory studies, ordering and review of radiographic studies, pulse oximetry, re-evaluation of patient's condition, obtaining history from patient or surrogate and review of old charts   (including critical care time)  Medications Ordered in ED Medications  vancomycin (VANCOREADY) IVPB 1250 mg/250 mL (has no administration in time range)  ceFEPIme (MAXIPIME) 2 g in sodium chloride 0.9 % 100 mL IVPB (has no administration in time range)  enoxaparin (LOVENOX) injection 40 mg (has no administration in time range)  methylPREDNISolone  sodium succinate (SOLU-MEDROL) 125 mg/2 mL injection 60 mg (has no administration in time range)    Followed by  predniSONE (DELTASONE) tablet 40 mg (has no administration in time range)  fluticasone furoate-vilanterol (BREO ELLIPTA) 100-25 MCG/INH 1 puff (has no administration in time range)  ipratropium-albuterol (DUONEB) 0.5-2.5 (3) MG/3ML nebulizer solution 3 mL (has no administration in time range)  albuterol (PROVENTIL) (2.5 MG/3ML) 0.083% nebulizer solution 2.5 mg (has no administration in time range)  ceFEPIme (MAXIPIME) 2 g in sodium chloride 0.9 % 100 mL IVPB (0 g Intravenous Stopped 11/29/19 1545)  metroNIDAZOLE (FLAGYL) IVPB 500 mg (0 mg Intravenous Stopped 11/29/19 1633)  sodium chloride 0.9 % bolus 1,000 mL (0 mLs Intravenous Stopped 11/29/19 1706)  vancomycin (VANCOREADY) IVPB 2000 mg/400 mL (0 mg Intravenous Stopped 11/29/19 1831)  acetaminophen (TYLENOL) tablet 650 mg (650 mg Oral Given 11/29/19 1330)  aspirin chewable tablet 324 mg (324 mg Oral Given 11/29/19 1501)  sodium chloride 0.9 % bolus 2,000 mL (0 mLs Intravenous Stopped 11/29/19 1924)  iohexol (OMNIPAQUE) 350 MG/ML injection 100 mL (100 mLs Intravenous Contrast Given 11/29/19 1603)  methylPREDNISolone sodium succinate (SOLU-MEDROL) 125 mg/2 mL injection 125 mg (125 mg Intravenous Given 11/29/19 1933)    ED Course  I have reviewed the triage vital signs and the nursing notes.  Pertinent labs & imaging results that were available during my care of the patient were reviewed by me and considered in my medical decision making (see chart for details).  44 year old presents for evaluation fever and chest pain.  Patient febrile, tachycardic, tachypneic and hypoxic on arrival.  Admits to cough, left chest pain, left arm pain and cramping pain to his left leg.  Admits to chronic back pain.  History of polysubstance use however has not used in "years."  No clinical evidence of DVT on exam however he is tender to his left calf.  Code  sepsis called.  Patient given broad-spectrum antibiotics and IV fluids.  Did initially have rhonchi to right lower lobe however this resolved after coughing.  Labs and imaging personally viewed and interpreted: Lactic acid 3.4>>2.6 CBC without leukocytosis Metabolic panel with mild hyperglycemia to 134 Procalcitonin 1.63 Urinalysis pending, patient refused  cath urine. Covid negative BNP 36 Troponin 9>>8 CTA chest negative for PE, infectious process CT abdomen without acute process Chest x-ray without acute infiltrates, cardiomegaly, pleural edema, pneumothorax EKG without STEMI  Patient reassessed.  Feels improvement after IV fluids.  No emesis here in the ED.  Reevaluation lungs clear.  He denies any current chest pain or shortness of breath.  Defervesced with Tylenol.  Unfortunately do not have MRI here given history of back pain polysubstance use.  Unknown source of cause of his fever and SIRS criteria.  Will defer additional imaging to hospitalist service.  Patient seen eval by attending, Dr. Langston Masker who agrees with the treatment, plan and disposition.  CONSULT with Dr. Nehemiah Settle with [facility] who will evaluate patient for admission.  The patient appears reasonably stabilized for admission considering the current resources, flow, and capabilities available in the ED at this time, and I doubt any other [facility] requiring further screening and/or treatment in the ED prior to admission.   Clinical Course as of Nov 28 2017  Sat Nov 29, 2019  1500 44 yo male w/ hx of polysubstance abuse ("Years ago") presenting from incarceration with fevers, chills, left arm and shoulder pain.  Reporting onset today.  Also reporting cramping and paresthesias in his left leg.  On arrival patient diaphoretic, tachycardic, tachypneic, and hypoxic requiring 2L [place].  Otherwise fairly well appearing clinically with good mental status.  His neurological exam is BENIGN with no objective signs of neurology weakness for me.   Abdominal exam also benign.  A sepsis w/u was initiated notable for lactate > 3, normal WBC, pending CT chest/abd/pelvis to evaluate for source of infection.  Concern for pulm source with hypoxia.  Covid pending (pt received both vaccines doses).  IV antibiotics BS ordered.  ECG nonischemic and initial trop 9.  UA pending.  PA provider to f/u on CT images, UA, anticipate admission.  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 epidural abscess would remain on the differential.  Patient may require transfer if MRI is not available here, per hospitalist discretion.   [MT]    Clinical Course User Index [MT] Trifan, Carola Rhine, MD   MDM Rules/Calculators/A&P                           Final Clinical Impression(s) / ED Diagnoses Final diagnoses:  SIRS (systemic inflammatory response syndrome) ([place])  Hypoxia    Rx / DC Orders ED Discharge Orders    None       Kyrin Gratz A, PA-C 11/29/19 2019    Wyvonnia Dusky, MD 11/30/19 [phone]

## 2019-11-29 NOTE — ED Notes (Signed)
Attempted report x1. 

## 2019-11-30 LAB — CBC
HCT: 42.8 % (ref 39.0–52.0)
Hemoglobin: 14.9 g/dL (ref 13.0–17.0)
MCH: 29.4 pg (ref 26.0–34.0)
MCHC: 34.8 g/dL (ref 30.0–36.0)
MCV: 84.4 fL (ref 80.0–100.0)
Platelets: 218 10*3/uL (ref 150–400)
RBC: 5.07 MIL/uL (ref 4.22–5.81)
RDW: 12 % (ref 11.5–15.5)
WBC: 13.9 10*3/uL — ABNORMAL HIGH (ref 4.0–10.5)
nRBC: 0 % (ref 0.0–0.2)

## 2019-11-30 LAB — RESPIRATORY PANEL BY PCR

## 2019-11-30 LAB — PROCALCITONIN: Procalcitonin: 7.12 ng/mL

## 2019-11-30 LAB — BASIC METABOLIC PANEL
Anion gap: 13 (ref 5–15)
BUN: 12 mg/dL (ref 6–20)
CO2: 19 mmol/L — ABNORMAL LOW (ref 22–32)
Calcium: 8.9 mg/dL (ref 8.9–10.3)
Chloride: 100 mmol/L (ref 98–111)
Creatinine, Ser: 0.92 mg/dL (ref 0.61–1.24)
GFR calc Af Amer: >60 mL/min (ref >60)
GFR calc non Af Amer: >60 mL/min (ref >60)
Glucose, Bld: 186 mg/dL — ABNORMAL HIGH (ref 70–99)
Potassium: 3.7 mmol/L (ref 3.5–5.1)
Sodium: 132 mmol/L — ABNORMAL LOW (ref 135–145)

## 2019-11-30 LAB — HIV ANTIBODY (ROUTINE TESTING W REFLEX): HIV Screen 4th Generation wRfx: NONREACTIVE

## 2019-11-30 MED ORDER — PREDNISONE 20 MG PO TABS
40.0000 mg | ORAL_TABLET | Freq: Every day | ORAL | Status: DC
  Administered 2019-12-01: 40 mg via ORAL
  Filled 2019-11-30: qty 2

## 2019-11-30 MED ORDER — IPRATROPIUM-ALBUTEROL 0.5-2.5 (3) MG/3ML IN SOLN: 3.0000 mL | Freq: Four times a day (QID) | RESPIRATORY_TRACT | Status: DC | PRN

## 2019-11-30 MED ORDER — POLYETHYLENE GLYCOL 3350 17 G PO PACK
17.0000 g | PACK | Freq: Every day | ORAL | Status: DC
  Administered 2019-12-01: 17 g via ORAL
  Filled 2019-11-30 (×2): qty 1

## 2019-11-30 MED ORDER — ENOXAPARIN SODIUM 40 MG/0.4ML ~~LOC~~ SOLN
40.0000 mg | Freq: Two times a day (BID) | SUBCUTANEOUS | Status: DC
  Administered 2019-11-30 (×2): 40 mg via SUBCUTANEOUS
  Filled 2019-11-30 (×2): qty 0.4

## 2019-11-30 NOTE — Progress Notes (Signed)
PROGRESS NOTE    Lance Jacobson  AGT:[id number] DOB: Apr 24, 1976 DOA: 11/29/2019 PCP: Patient, No Pcp Per   Brief Narrative:  Per HPI: Lance Jacobson is a 44 y.o. male with a history of Hypertension, not on medication, h/o IV drug use, former smoker (quit about 6 weeks ago).  Used to smoke approximately 1 pack a day.  He presents with sudden onset of fever, shortness of breath, chest pain in his left chest that radiated to his left shoulder and arm.  He has a sensation of needing to cough, but has not been able to cough anything up.  He has had a Covid vaccine and no current Covid cases in the facility where he is staying.  Emergency Department Course: Chest x-ray normal, CTA chest normal.  CT abdomen pelvis unremarkable.  White count normal.  Oxygen saturation 88% on arrival and improved to mid 90s on oxygen.  Patient febrile.  Broad-spectrum antibiotics started.HPI:  -Patient continues to have some chest pain, but this is improved since admission.  He remains on 2 L nasal cannula oxygen.  Seen by PT and appears to be able to wean off oxygen and can ambulate near baseline.  Assessment & Plan:   Active Problems:   Acute respiratory failure with hypoxia (HCC)   COPD with acute exacerbation (HCC)   SIRS (systemic inflammatory response syndrome) (HCC)   Left-sided chest pain-improving -Tenderness to palpation of her left upper pectoral and bicep tendon -Suspect that this is related to musculoskeletal causes -Continue to monitor  SIRS criteria with procalcitonin elevation -Continue on cefepime and DC vancomycin since MRSA negative -Patient does have history of IV drug use and may require echocardiogram if blood cultures are positive -Blood cultures pending -Monitor a.m. labs  Acute hypoxemic respiratory failure -Noted to have some lower lung volumes and may be related to atelectasis that may have occurred as a result of chest pain -May also be related to COPD but I do not suspect  acute exacerbation -Plan to keep duo nebs as needed -Taper down to oral steroids for now -Wean as tolerated   DVT prophylaxis: Lovenox Code Status: Full Family Communication: Guarded bedside Disposition Plan: Continue to monitor for any potential infection and discharge once stable. Status is: Inpatient  Remains inpatient appropriate because:Unsafe d/c plan and IV treatments appropriate due to intensity of illness or inability to take PO   Dispo: The patient is from: Home              Anticipated d/c is to: Home              Anticipated d/c date is: 2 days              Patient currently is not medically stable to d/c.  Consultants:   None  Procedures:   See below  Antimicrobials:  Anti-infectives (From admission, onward)   Start     Dose/Rate Route Frequency Ordered Stop   11/30/19 0100  vancomycin (VANCOREADY) IVPB 1250 mg/250 mL  Status:  Discontinued        1,250 mg 166.7 mL/hr over 90 Minutes Intravenous Every 12 hours 11/29/19 1426 11/30/19 1154   11/29/19 2200  ceFEPIme (MAXIPIME) 2 g in sodium chloride 0.9 % 100 mL IVPB     Discontinue     2 g 200 mL/hr over 30 Minutes Intravenous Every 8 hours 11/29/19 1426     11/29/19 1500  vancomycin (VANCOREADY) IVPB 2000 mg/400 mL        2,000 mg  200 mL/hr over 120 Minutes Intravenous  Once 11/29/19 1425 11/29/19 1831   11/29/19 1430  ceFEPIme (MAXIPIME) 2 g in sodium chloride 0.9 % 100 mL IVPB        2 g 200 mL/hr over 30 Minutes Intravenous  Once 11/29/19 1421 11/29/19 1545   11/29/19 1430  metroNIDAZOLE (FLAGYL) IVPB 500 mg        500 mg 100 mL/hr over 60 Minutes Intravenous  Once 11/29/19 1421 11/29/19 1633   11/29/19 1430  vancomycin (VANCOCIN) IVPB 1000 mg/200 mL premix  Status:  Discontinued        1,000 mg 200 mL/hr over 60 Minutes Intravenous  Once 11/29/19 1421 11/29/19 1424       Subjective: Patient seen and evaluated today with no new acute complaints or concerns. No acute concerns or events noted  overnight.  He says his chest pain has improved.  Objective: Vitals:   11/30/19 0009 11/30/19 0418 11/30/19 0823 11/30/19 1018  BP: 133/81 115/74 112/72   Pulse: 87 93 93   Resp: 18 18 18    Temp: 98.7 F (37.1 C) 98.7 F (37.1 C) 98.1 F (36.7 C)   TempSrc: Oral Oral Oral   SpO2: 98% 97% 95% 96%  Weight:      Height:        Intake/Output Summary (Last 24 hours) at 11/30/2019 1153 Last data filed at 11/30/2019 0400 Gross per 24 hour  Intake 4425.43 ml  Output 900 ml  Net 3525.43 ml   Filed Weights   11/29/19 1332 11/29/19 2011  Weight: 90.7 kg 107.2 kg    Examination:  General exam: Appears calm and comfortable  Respiratory system: Clear to auscultation. Respiratory effort normal.  Currently on 2 L nasal cannula oxygen. Cardiovascular system: S1 & S2 heard, RRR. No JVD, murmurs, rubs, gallops or clicks. No pedal edema. Gastrointestinal system: Abdomen is nondistended, soft and nontender. No organomegaly or masses felt. Normal bowel sounds heard. Central nervous system: Alert and oriented. No focal neurological deficits. Extremities: Symmetric 5 x 5 power.  Left chest wall tenderness to palpation. Skin: No rashes, lesions or ulcers Psychiatry: Judgement and insight appear normal. Mood & affect appropriate.     Data Reviewed: I have personally reviewed following labs and imaging studies  CBC: Recent Labs  Lab 11/29/19 1449 11/30/19 0616  WBC 9.6 13.9*  NEUTROABS 8.8*  --   HGB 16.6 14.9  HCT 45.9 42.8  MCV 82.0 84.4  PLT 214 218   Basic Metabolic Panel: Recent Labs  Lab 11/29/19 1449 11/30/19 0616  NA 136 132*  K 3.5 3.7  CL 106 100  CO2 21* 19*  GLUCOSE 134* 186*  BUN 15 12  CREATININE 1.00 0.92  CALCIUM 9.1 8.9   GFR: Estimated Creatinine Clearance: 119.3 mL/min (by C-G formula based on SCr of 0.92 mg/dL). Liver Function Tests: Recent Labs  Lab 11/29/19 1449  AST 32  ALT 69*  ALKPHOS 57  BILITOT 0.9  PROT 7.8  ALBUMIN 4.4   No results  for input(s): LIPASE, AMYLASE in the last 168 hours. No results for input(s): AMMONIA in the last 168 hours. Coagulation Profile: Recent Labs  Lab 11/29/19 1449  INR 1.0   Cardiac Enzymes: No results for input(s): CKTOTAL, CKMB, CKMBINDEX, TROPONINI in the last 168 hours. BNP (last 3 results) No results for input(s): PROBNP in the last 8760 hours. HbA1C: No results for input(s): HGBA1C in the last 72 hours. CBG: No results for input(s): GLUCAP in the last 168  hours. Lipid Profile: No results for input(s): CHOL, HDL, LDLCALC, TRIG, CHOLHDL, LDLDIRECT in the last 72 hours. Thyroid Function Tests: No results for input(s): TSH, T4TOTAL, FREET4, T3FREE, THYROIDAB in the last 72 hours. Anemia Panel: No results for input(s): VITAMINB12, FOLATE, FERRITIN, TIBC, IRON, RETICCTPCT in the last 72 hours. Sepsis Labs: Recent Labs  Lab 11/29/19 1449 11/29/19 1454 11/29/19 1700 11/29/19 1946 11/30/19 0616  PROCALCITON 1.63  --   --   --  7.12  LATICACIDVEN  --  3.4* 2.6* 2.1*  --     Recent Results (from the past 240 hour(s))  Blood Culture (routine x 2)     Status: None (Preliminary result)   Collection Time: 11/29/19  2:52 PM   Specimen: BLOOD RIGHT HAND  Result Value Ref Range Status   Specimen Description   Final    BLOOD RIGHT HAND BOTTLES DRAWN AEROBIC AND ANAEROBIC   Special Requests   Final    Blood Culture adequate volume Performed at [facility], [address] [place], [place] [address]    Culture PENDING  Incomplete   Report Status PENDING  Incomplete  Blood Culture (routine x 2)     Status: None (Preliminary result)   Collection Time: 11/29/19  2:55 PM   Specimen: BLOOD LEFT HAND  Result Value Ref Range Status   Specimen Description   Final    BLOOD LEFT HAND BOTTLES DRAWN AEROBIC AND ANAEROBIC   Special Requests   Final    Blood Culture adequate volume Performed at [facility], [address] [place], [place] [address]    Culture PENDING  Incomplete   Report  Status PENDING  Incomplete  SARS Coronavirus 2 by RT PCR (hospital order, [facility]) Nasopharyngeal Nasopharyngeal Swab     Status: None   Collection Time: 11/29/19  3:41 PM   Specimen: Nasopharyngeal Swab  Result Value Ref Range Status   SARS Coronavirus 2 NEGATIVE NEGATIVE Final    Comment: (NOTE) SARS-CoV-2 target nucleic acids are NOT DETECTED.  The SARS-CoV-2 RNA is generally detectable in upper and lower respiratory specimens during the acute phase of infection. The lowest concentration of SARS-CoV-2 viral copies this assay can detect is 250 copies / mL. A negative result does not preclude SARS-CoV-2 infection and should not be used as the sole basis for treatment or other patient management decisions.  A negative result may occur with improper specimen collection / handling, submission of specimen other than nasopharyngeal swab, presence of viral mutation(s) within the areas targeted by this assay, and inadequate number of viral copies (<250 copies / mL). A negative result must be combined with clinical observations, patient history, and epidemiological information.  Fact Sheet for Patients:   BoilerBrush.com.cyhttps://www.fda.gov/media/136312/download  Fact Sheet for Healthcare Providers: https://pope.com/https://www.fda.gov/media/136313/download  This test is not yet approved or  cleared by the [place] FDA and has been authorized for detection and/or diagnosis of SARS-CoV-2 by FDA under an Emergency Use Authorization (EUA).  This EUA will remain in effect (meaning this test can be used) for the duration of the COVID-19 declaration under Section 564(b)(1) of the Act, 21 U.S.C. section 360bbb-3(b)(1), unless the authorization is terminated or revoked sooner.  Performed at [facility], [address] [place], [place] [address]   MRSA PCR Screening     Status: None   Collection Time: 11/29/19 10:10 PM   Specimen: Nasal Mucosa; Nasopharyngeal  Result Value Ref Range Status    MRSA by PCR NEGATIVE NEGATIVE Final  Comment:        The GeneXpert MRSA Assay (FDA approved for NASAL specimens only), is one component of a comprehensive MRSA colonization surveillance program. It is not intended to diagnose MRSA infection nor to guide or monitor treatment for MRSA infections. Performed at [facility], [address], [place], [place] [address]          Radiology Studies: CT Angio Chest PE W/Cm &/Or Wo Cm  Result Date: 11/29/2019 CLINICAL DATA:  Shortness of breath and left-sided chest pain. EXAM: CT ANGIOGRAPHY CHEST WITH CONTRAST TECHNIQUE: Multidetector CT imaging of the chest was performed using the standard protocol during bolus administration of intravenous contrast. Multiplanar CT image reconstructions and MIPs were obtained to evaluate the vascular anatomy. CONTRAST:  OMNIPAQUE IOHEXOL 350 MG/ML SOLN COMPARISON:  Chest x-ray dated 11/29/2019 FINDINGS: Cardiovascular: Satisfactory opacification of the pulmonary arteries to the segmental level. No evidence of pulmonary embolism. Normal heart size. No pericardial effusion. Mediastinum/Nodes: No enlarged mediastinal, hilar, or axillary lymph nodes. Thyroid gland, trachea, and esophagus demonstrate no significant findings. Lungs/Pleura: Lungs are clear. No pleural effusion or pneumothorax. Upper Abdomen: No acute abnormality. Musculoskeletal: No chest wall abnormality. No acute or significant osseous findings. Review of the MIP images confirms the above findings. IMPRESSION: Normal exam. Electronically Signed   By: Francene Boyers M.D.   On: 11/29/2019 16:25   CT ABDOMEN PELVIS W CONTRAST  Result Date: 11/29/2019 CLINICAL DATA:  Abdominal pain and fever. EXAM: CT ABDOMEN AND PELVIS WITH CONTRAST TECHNIQUE: Multidetector CT imaging of the abdomen and pelvis was performed using the standard protocol following bolus administration of intravenous contrast. CONTRAST:  OMNIPAQUE IOHEXOL 350 MG/ML SOLN  COMPARISON:  None. FINDINGS: Lower chest: Normal. Hepatobiliary: Hepatic steatosis. Biliary tree is normal including the gallbladder. Pancreas: Unremarkable. No pancreatic ductal dilatation or surrounding inflammatory changes. Spleen: Normal in size without focal abnormality. Adrenals/Urinary Tract: Adrenal glands are unremarkable. Kidneys are normal, without renal calculi, focal lesion, or hydronephrosis. Bladder is unremarkable. Stomach/Bowel: Stomach is within normal limits. Appendix has been removed. No evidence of bowel wall thickening, distention, or inflammatory changes. Vascular/Lymphatic: No significant vascular findings are present. No enlarged abdominal or pelvic lymph nodes. Reproductive: Prostate is unremarkable. Other: No abdominal wall hernia or abnormality. No abdominopelvic ascites. Musculoskeletal: No acute abnormality. Bilateral pars defects at L5. No spondylolisthesis at this time. Otherwise normal. IMPRESSION: 1. No acute abnormalities of the abdomen or pelvis. 2. Hepatic steatosis. 3. Bilateral pars defects at L5. Electronically Signed   By: Francene Boyers M.D.   On: 11/29/2019 16:29   DG Chest Port 1 View  Result Date: 11/29/2019 CLINICAL DATA:  Patient went to eat today and came back and started having pain in left arm, left shoulder, and left chest. He also has cramping in left leg. Pt has a history of HTN, Patient was warm to touch and appeared diaphoretic. EXAM: PORTABLE CHEST - 1 VIEW COMPARISON:  none FINDINGS: Relatively low lung volumes with some crowding bronchovascular structures at the lung bases. No focal airspace consolidation or overt edema. Heart size and mediastinal contours are within normal limits. No effusion.  No pneumothorax. Visualized bones unremarkable. IMPRESSION: Low volumes.  No acute disease. Electronically Signed   By: Corlis Leak M.D.   On: 11/29/2019 14:56        Scheduled Meds: . enoxaparin (LOVENOX) injection  40 mg Subcutaneous Q24H  . fluticasone  furoate-vilanterol  1 puff Inhalation Daily  . ipratropium-albuterol  3 mL Nebulization Q6H  . methylPREDNISolone (  SOLU-MEDROL) injection  60 mg Intravenous Q6H   Followed by  . [START ON 12/01/2019] predniSONE  40 mg Oral Q breakfast  . polyethylene glycol  17 g Oral Daily   Continuous Infusions: . ceFEPime (MAXIPIME) IV 2 g (11/30/19 9371)  . vancomycin 1,250 mg (11/30/19 0004)     LOS: 1 day    Time spent: 35 minutes    Jazmene Racz D Sherryll Burger, DO [facility]  If 7PM-7AM, please contact night-coverage [email or web address] 11/30/2019, 11:53 AM

## 2019-11-30 NOTE — Plan of Care (Signed)
  Problem: Acute Rehab PT Goals(only PT should resolve) Goal: Pt Will Go Supine/Side To Sit Outcome: Progressing Flowsheets (Taken 11/30/2019 1135) Pt will go Supine/Side to Sit:  Independently  with modified independence Goal: Patient Will Transfer Sit To/From Stand Outcome: Progressing Flowsheets (Taken 11/30/2019 1135) Patient will transfer sit to/from stand: with modified independence Goal: Pt Will Transfer Bed To Chair/Chair To Bed Outcome: Progressing Flowsheets (Taken 11/30/2019 1135) Pt will Transfer Bed to Chair/Chair to Bed: with modified independence Goal: Pt Will Ambulate Outcome: Progressing Flowsheets (Taken 11/30/2019 1135) Pt will Ambulate:  > 125 feet  with supervision  with modified independence   11:35 AM, 11/30/19 Ocie Bob, MPT Physical Therapist with [facility] 336 [phone] office [phone] mobile phone

## 2019-11-30 NOTE — Evaluation (Signed)
Physical Therapy Evaluation Patient Details Name: Lance Jacobson MRN: [id number] DOB: 1975/06/27 Today's Date: 11/30/2019   History of Present Illness  Lance Jacobson is a 44 y.o. male with a history of Hypertension, not on medication, h/o IV drug use, former smoker (quit about 6 weeks ago).  Used to smoke approximately 1 pack a day.  He presents with sudden onset of fever, shortness of breath, chest pain in his left chest that radiated to his left shoulder and arm.  He has a sensation of needing to cough, but has not been able to cough anything up.  He has had a Covid vaccine and no current Covid cases in the facility where he is staying.    Clinical Impression  Patient functioning near baseline for functional mobility and gait, c/o left leg pain and has tendency to lean on side rails in hallway for support during ambulation, occasional standing rest breaks, no loss of balance, on room air with SpO2 at 97% - RN notified.  Patient tolerated sitting up at bedside after therapy with prison guard in room.  Patient will benefit from continued physical therapy in hospital and recommended venue below to increase strength, balance, endurance for safe ADLs and gait.     Follow Up Recommendations Home health PT;Supervision for mobility/OOB;Supervision - Intermittent    Equipment Recommendations  None recommended by PT    Recommendations for Other Services       Precautions / Restrictions Precautions Precautions: Fall Restrictions Weight Bearing Restrictions: No      Mobility  Bed Mobility Overal bed mobility: Modified Independent                Transfers Overall transfer level: Needs assistance Equipment used: None;1 person hand held assist Transfers: Sit to/from Stand;Stand Pivot Transfers Sit to Stand: Supervision Stand pivot transfers: Supervision          Ambulation/Gait Ambulation/Gait assistance: Min guard Gait Distance (Feet): 80 Feet Assistive device: None Gait  Pattern/deviations: Decreased step length - left;Decreased stance time - left;Decreased stride length;Antalgic Gait velocity: decreased   General Gait Details: labored cadence with frequent use of side rail for support, no loss of balance, occasional standing rest breaks, on room air with SpO2 at 97%  Stairs            Wheelchair Mobility    Modified Rankin (Stroke Patients Only)       Balance Overall balance assessment: Mild deficits observed, not formally tested                                           Pertinent Vitals/Pain Pain Assessment: Faces Faces Pain Scale: Hurts little more Pain Location: LLE Pain Descriptors / Indicators: Sore;Guarding Pain Intervention(s): Limited activity within patient's tolerance;Monitored during session    Home Living Family/patient expects to be discharged to:: Other (Comment) (presently incarcerated)                      Prior Function Level of Independence: Independent         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     Hand Dominance        Extremity/Trunk Assessment   Upper Extremity Assessment Upper Extremity Assessment: Overall WFL for tasks assessed    Lower Extremity Assessment Lower Extremity Assessment: Generalized weakness;RLE deficits/detail;LLE deficits/detail RLE Deficits / Details: grossly 5/5 RLE Sensation: WNL RLE Coordination: WNL LLE Deficits /  Details: grossly 4/5 LLE Sensation: WNL LLE Coordination: WNL    Cervical / Trunk Assessment Cervical / Trunk Assessment: Normal  Communication   Communication: No difficulties  Cognition Arousal/Alertness: Awake/alert Behavior During Therapy: WFL for tasks assessed/performed Overall Cognitive Status: Within Functional Limits for tasks assessed                                        General Comments      Exercises     Assessment/Plan    PT Assessment Patient needs continued PT services  PT Problem  List Decreased strength;Decreased activity tolerance;Decreased balance;Decreased mobility       PT Treatment Interventions Balance training;Gait training;Stair training;Functional mobility training;Therapeutic activities;Therapeutic exercise;Patient/family education    PT Goals (Current goals can be found in the Care Plan section)  Acute Rehab PT Goals Patient Stated Goal: walk without pain left leg PT Goal Formulation: With patient Time For Goal Achievement: 12/04/19 Potential to Achieve Goals: Good    Frequency Min 3X/week   Barriers to discharge        Co-evaluation               AM-PAC PT "6 Clicks" Mobility  Outcome Measure Help needed turning from your back to your side while in a flat bed without using bedrails?: None Help needed moving from lying on your back to sitting on the side of a flat bed without using bedrails?: None Help needed moving to and from a bed to a chair (including a wheelchair)?: A Little Help needed standing up from a chair using your arms (e.g., wheelchair or bedside chair)?: A Little Help needed to walk in hospital room?: A Little Help needed climbing 3-5 steps with a railing? : A Little 6 Click Score: 20    End of Session   Activity Tolerance: Patient tolerated treatment well;Patient limited by fatigue Patient left: in bed;with call bell/phone within reach;with nursing/sitter in room;Other (comment) (left sitting up at bedside with prison guard in room) Nurse Communication: Mobility status PT Visit Diagnosis: Unsteadiness on feet (R26.81);Other abnormalities of gait and mobility (R26.89);Muscle weakness (generalized) (M62.81)    Time: 2979-8921 PT Time Calculation (min) (ACUTE ONLY): 22 min   Charges:   PT Evaluation $PT Eval Moderate Complexity: 1 Mod PT Treatments $Therapeutic Activity: 23-37 mins        11:33 AM, 11/30/19 Ocie Bob, MPT Physical Therapist with [facility] [phone] office [phone]  mobile phone

## 2019-11-30 NOTE — Progress Notes (Signed)
Dressing to abdomen changed. PRN Dilaudid given prior, tolerated with minimal discomfort.

## 2019-12-01 LAB — URINE CULTURE: Culture: NO GROWTH

## 2019-12-01 LAB — BASIC METABOLIC PANEL
Anion gap: 8 (ref 5–15)
BUN: 16 mg/dL (ref 6–20)
CO2: 23 mmol/L (ref 22–32)
Calcium: 8.7 mg/dL — ABNORMAL LOW (ref 8.9–10.3)
Chloride: 104 mmol/L (ref 98–111)
Creatinine, Ser: 0.83 mg/dL (ref 0.61–1.24)
GFR calc Af Amer: >60 mL/min (ref >60)
GFR calc non Af Amer: >60 mL/min (ref >60)
Glucose, Bld: 167 mg/dL — ABNORMAL HIGH (ref 70–99)
Potassium: 3.6 mmol/L (ref 3.5–5.1)
Sodium: 135 mmol/L (ref 135–145)

## 2019-12-01 LAB — CBC
HCT: 40 % (ref 39.0–52.0)
Hemoglobin: 14.2 g/dL (ref 13.0–17.0)
MCH: 30.2 pg (ref 26.0–34.0)
MCHC: 35.5 g/dL (ref 30.0–36.0)
MCV: 85.1 fL (ref 80.0–100.0)
Platelets: 236 10*3/uL (ref 150–400)
RBC: 4.7 MIL/uL (ref 4.22–5.81)
RDW: 12.5 % (ref 11.5–15.5)
WBC: 11.7 10*3/uL — ABNORMAL HIGH (ref 4.0–10.5)
nRBC: 0 % (ref 0.0–0.2)

## 2019-12-01 LAB — PROCALCITONIN: Procalcitonin: 4.54 ng/mL

## 2019-12-01 LAB — LACTIC ACID, PLASMA: Lactic Acid, Venous: 1.7 mmol/L (ref 0.5–1.9)

## 2019-12-01 MED ORDER — AMOXICILLIN-POT CLAVULANATE 875-125 MG PO TABS
1.0000 | ORAL_TABLET | Freq: Two times a day (BID) | ORAL | 0 refills | Status: AC
Start: 2019-12-01 — End: 2019-12-06

## 2019-12-01 MED ORDER — ALBUTEROL SULFATE HFA 108 (90 BASE) MCG/ACT IN AERS
2.0000 | INHALATION_SPRAY | Freq: Four times a day (QID) | RESPIRATORY_TRACT | 2 refills | Status: AC | PRN
Start: 2019-12-01 — End: ?

## 2019-12-01 NOTE — Progress Notes (Signed)
Initial Nutrition Assessment  DOCUMENTATION CODES:   Obesity unspecified  INTERVENTION:  Recommend CHO modified/Heart Healthy diet at discharge   NUTRITION DIAGNOSIS:   Inadequate oral intake related to acute illness (SIRS, COPD exacerbation) as evidenced by estimated needs.   GOAL:  Patient will meet greater than or equal to 90% of their needs   MONITOR:  PO intake, Labs, I & O's, Weight trends, Skin  REASON FOR ASSESSMENT:   Consult Assessment of nutrition requirement/status   ASSESSMENT: Patient is a 44 yo male who presents with fever, shortness of breath and chest pain. History of IV drug use, HTN. No acute findings Chest x-ray, CT abdomen/pelvis. COPD exacerbation, SIRS and acute hypoxemic respiratory failure.   Regular diet with intake at 100%. Patient denies any difficulty with consuming meals. Patient is being discharged as assessment is completed. He is returning to facility where his meals are prepared for him.  Labs reviewed: Glucose 167, WBC-11.7   Medications reviewed and include: Miralax, Prednisone   Patient reports usual weight 190-200 lb. Limited weight history available. Currently 107.2 kg (235.8 lb).   NUTRITION - FOCUSED PHYSICAL EXAM:    Most Recent Value  Orbital Region No depletion  Upper Arm Region No depletion  Thoracic and Lumbar Region No depletion  Buccal Region No depletion  Temple Region No depletion  Clavicle and Acromion Bone Region No depletion  Dorsal Hand No depletion  Edema (RD Assessment) None  Hair Unable to assess  Eyes Reviewed  Mouth Reviewed  Skin Reviewed      Diet Order:   Diet Order            Diet - low sodium heart healthy           Diet regular Room service appropriate? Yes; Fluid consistency: Thin  Diet effective now                 EDUCATION NEEDS:   No education needs have been identified at this time Skin:  Skin Assessment: Reviewed RN Assessment  Last BM:  unknown- distended abdomen  Height:    Ht Readings from Last 1 Encounters:  11/29/19 5\' 5"  (1.651 m)    Weight:   Wt Readings from Last 1 Encounters:  11/29/19 107.2 kg    Ideal Body Weight:   62 kg  BMI:  Body mass index is 39.33 kg/m.  Estimated Nutritional Needs:   Kcal:  12/01/19  Protein:  124-136  Fluid:  >2.3 liters daily   [id number] MS,RD,CSG,LDN Pager: available in [place]

## 2019-12-01 NOTE — Discharge Summary (Addendum)
Physician Discharge Summary  Lance Jacobson KNL:[id number] DOB: 02/21/1976 DOA: 11/29/2019  PCP: Patient, No Pcp Per  Admit date: 11/29/2019  Discharge date: 12/01/2019  [facility]  [facility]  [facility]  Recommendations for Outpatient Follow-up:  1. Follow up with PCP in 1-2 weeks 2. Please obtain BMP/CBC in one week 3. Remain on Augmentin as prescribed for 5 more days to complete course of treatment empirically 4. No growth on blood or urine cultures.  Respiratory panel negative.  Covid testing negative. 5. Albuterol inhaler given as needed for shortness of breath or wheezing  Home Health: Yes with PT  Equipment/Devices: None  Discharge Condition: Stable  CODE STATUS: Full  Diet recommendation: Heart Healthy  Brief/Interim Summary: Per HPI: Lance Crawfordis a 44 y.o.malewith a history of Hypertension, not on medication, h/o IV drug use, former smoker (quitabout 6 weeks ago).Used to smoke approximately 1 pack a day. He presents with sudden onset of fever, shortness of breath, chest pain in his left chest that radiated to his left shoulder and arm. He has a sensation of needing to cough, but has not been able to cough anything up.He has had a Covid vaccine and no current Covid cases in the facility where he is staying.  Emergency Department Course: Chest x-ray normal, CTA chest normal. CT abdomen pelvis unremarkable. White count normal. Oxygen saturation 88% on arrival and improved to mid 90s on oxygen. Patient febrile. Broad-spectrum antibiotics started.  -Patient was admitted with SIRS criteria and procalcitonin elevation, but not clear sepsis given the fact that he did not have any identifiable source of infection.  He was placed on vancomycin and cefepime empirically on admission and was then simply kept on cefepime as MRSA PCR was negative.  He has had no growth in his blood cultures or urine cultures and no identifiable source of infection.  He did have  elevation of his procalcitonin levels which are now downtrending and he appears to be feeling overall much better with left chest wall pain that has essentially resolved.  He has no further shortness of breath and was initially treated with for some possible COPD exacerbation.  He will be discharged on Augmentin for 5 more days to complete a total 7-day course of treatment empirically.  He will be given albuterol inhaler as needed.  He has no wheezing and therefore does not appear to need any steroids on discharge.  He should have repeat lab work done as ordered in 1 week.  His respiratory panel as well as Covid testing were both negative.  He has also been seen by physical therapy due to some gait instability with home health PT recommended and this has been ordered.  Discharge Diagnoses:  Active Problems:   Acute respiratory failure with hypoxia (HCC)   COPD with acute exacerbation (HCC)   SIRS (systemic inflammatory response syndrome) (HCC)    Discharge Instructions  Discharge Instructions    Diet - low sodium heart healthy   Complete by: As directed    Increase activity slowly   Complete by: As directed      Allergies as of 12/01/2019   No Known Allergies     Medication List    TAKE these medications   albuterol 108 (90 Base) MCG/ACT inhaler Commonly known as: VENTOLIN HFA Inhale 2 puffs into the lungs every 6 (six) hours as needed for wheezing or shortness of breath.   amoxicillin-clavulanate 875-125 MG tablet Commonly known as: Augmentin Take 1 tablet by mouth 2 (two) times daily for 5  days.       Follow-up Information    pcp Follow up in 1 week(s).              No Known Allergies  Consultations:  None   Procedures/Studies: CT Angio Chest PE W/Cm &/Or Wo Cm  Result Date: 11/29/2019 CLINICAL DATA:  Shortness of breath and left-sided chest pain. EXAM: CT ANGIOGRAPHY CHEST WITH CONTRAST TECHNIQUE: Multidetector CT imaging of the chest was performed using the  standard protocol during bolus administration of intravenous contrast. Multiplanar CT image reconstructions and MIPs were obtained to evaluate the vascular anatomy. CONTRAST:  OMNIPAQUE IOHEXOL 350 MG/ML SOLN COMPARISON:  Chest x-ray dated 11/29/2019 FINDINGS: Cardiovascular: Satisfactory opacification of the pulmonary arteries to the segmental level. No evidence of pulmonary embolism. Normal heart size. No pericardial effusion. Mediastinum/Nodes: No enlarged mediastinal, hilar, or axillary lymph nodes. Thyroid gland, trachea, and esophagus demonstrate no significant findings. Lungs/Pleura: Lungs are clear. No pleural effusion or pneumothorax. Upper Abdomen: No acute abnormality. Musculoskeletal: No chest wall abnormality. No acute or significant osseous findings. Review of the MIP images confirms the above findings. IMPRESSION: Normal exam. Electronically Signed   By: Francene Boyers M.D.   On: 11/29/2019 16:25   CT ABDOMEN PELVIS W CONTRAST  Result Date: 11/29/2019 CLINICAL DATA:  Abdominal pain and fever. EXAM: CT ABDOMEN AND PELVIS WITH CONTRAST TECHNIQUE: Multidetector CT imaging of the abdomen and pelvis was performed using the standard protocol following bolus administration of intravenous contrast. CONTRAST:  OMNIPAQUE IOHEXOL 350 MG/ML SOLN COMPARISON:  None. FINDINGS: Lower chest: Normal. Hepatobiliary: Hepatic steatosis. Biliary tree is normal including the gallbladder. Pancreas: Unremarkable. No pancreatic ductal dilatation or surrounding inflammatory changes. Spleen: Normal in size without focal abnormality. Adrenals/Urinary Tract: Adrenal glands are unremarkable. Kidneys are normal, without renal calculi, focal lesion, or hydronephrosis. Bladder is unremarkable. Stomach/Bowel: Stomach is within normal limits. Appendix has been removed. No evidence of bowel wall thickening, distention, or inflammatory changes. Vascular/Lymphatic: No significant vascular findings are present. No enlarged  abdominal or pelvic lymph nodes. Reproductive: Prostate is unremarkable. Other: No abdominal wall hernia or abnormality. No abdominopelvic ascites. Musculoskeletal: No acute abnormality. Bilateral pars defects at L5. No spondylolisthesis at this time. Otherwise normal. IMPRESSION: 1. No acute abnormalities of the abdomen or pelvis. 2. Hepatic steatosis. 3. Bilateral pars defects at L5. Electronically Signed   By: Francene Boyers M.D.   On: 11/29/2019 16:29   DG Chest Port 1 View  Result Date: 11/29/2019 CLINICAL DATA:  Patient went to eat today and came back and started having pain in left arm, left shoulder, and left chest. He also has cramping in left leg. Pt has a history of HTN, Patient was warm to touch and appeared diaphoretic. EXAM: PORTABLE CHEST - 1 VIEW COMPARISON:  none FINDINGS: Relatively low lung volumes with some crowding bronchovascular structures at the lung bases. No focal airspace consolidation or overt edema. Heart size and mediastinal contours are within normal limits. No effusion.  No pneumothorax. Visualized bones unremarkable. IMPRESSION: Low volumes.  No acute disease. Electronically Signed   By: Corlis Leak M.D.   On: 11/29/2019 14:56     Discharge Exam: Vitals:   12/01/19 0750 12/01/19 1100  BP:    Pulse:    Resp:    Temp:    SpO2: 98% 99%   Vitals:   11/30/19 2058 12/01/19 0600 12/01/19 0750 12/01/19 1100  BP: (!) 104/59 118/78    Pulse: 94 65    Resp: 18 18  Temp: 98.5 F (36.9 C) 97.8 F (36.6 C)    TempSrc: Oral Oral    SpO2: 98% 99% 98% 99%  Weight:      Height:        General: Pt is alert, awake, not in acute distress Cardiovascular: RRR, S1/S2 +, no rubs, no gallops Respiratory: CTA bilaterally, no wheezing, no rhonchi Abdominal: Soft, NT, ND, bowel sounds + Extremities: no edema, no cyanosis    The results of significant diagnostics from this hospitalization (including imaging, microbiology, ancillary and laboratory) are listed below for  reference.     Microbiology: Recent Results (from the past 240 hour(s))  Urine culture     Status: None   Collection Time: 11/29/19  2:19 PM   Specimen: In/Out Cath Urine  Result Value Ref Range Status   Specimen Description   Final    IN/OUT CATH URINE Performed at [facility], [address], [place], [place] [address]    Special Requests   Final    NONE Performed at [facility], [address], [place], [place] [address]    Culture   Final    NO GROWTH Performed at [facility], [address] [place], [place] [address]    Report Status 12/01/2019 FINAL  Final  Blood Culture (routine x 2)     Status: None (Preliminary result)   Collection Time: 11/29/19  2:52 PM   Specimen: BLOOD RIGHT HAND  Result Value Ref Range Status   Specimen Description   Final    BLOOD RIGHT HAND BOTTLES DRAWN AEROBIC AND ANAEROBIC   Special Requests Blood Culture adequate volume  Final   Culture   Final    NO GROWTH 2 DAYS Performed at [facility], [address] [place], [place] [address]    Report Status PENDING  Incomplete  Blood Culture (routine x 2)     Status: None (Preliminary result)   Collection Time: 11/29/19  2:55 PM   Specimen: BLOOD LEFT HAND  Result Value Ref Range Status   Specimen Description   Final    BLOOD LEFT HAND BOTTLES DRAWN AEROBIC AND ANAEROBIC   Special Requests Blood Culture adequate volume  Final   Culture   Final    NO GROWTH 2 DAYS Performed at [facility], [address], [place], [place] [address]    Report Status PENDING  Incomplete  SARS Coronavirus 2 by RT PCR (hospital order, performed in [facility] hospital lab) Nasopharyngeal Nasopharyngeal Swab     Status: None   Collection Time: 11/29/19  3:41 PM   Specimen: Nasopharyngeal Swab  Result Value Ref Range Status   SARS Coronavirus 2 NEGATIVE NEGATIVE Final    Comment: (NOTE) SARS-CoV-2 target nucleic acids are NOT DETECTED.  The SARS-CoV-2 RNA is generally detectable in upper and  lower respiratory specimens during the acute phase of infection. The lowest concentration of SARS-CoV-2 viral copies this assay can detect is 250 copies / mL. A negative result does not preclude SARS-CoV-2 infection and should not be used as the sole basis for treatment or other patient management decisions.  A negative result may occur with improper specimen collection / handling, submission of specimen other than nasopharyngeal swab, presence of viral mutation(s) within the areas targeted by this assay, and inadequate number of viral copies (<250 copies / mL). A negative result must be combined with clinical observations, patient history, and epidemiological information.  Fact Sheet for Patients:   BoilerBrush.com.cy  Fact Sheet for Healthcare Providers: https://pope.com/  This test is not yet approved or  cleared by the [place] and has been authorized for detection and/or diagnosis of SARS-CoV-2 by FDA under an Emergency Use Authorization (EUA).  This EUA will remain in effect (meaning this test can be used) for the duration of the COVID-19 declaration under Section 564(b)(1) of the Act, 21 U.S.C. section 360bbb-3(b)(1), unless the authorization is terminated or revoked sooner.  Performed at [facility], [address], [place], [place] [address]   MRSA PCR Screening     Status: None   Collection Time: 11/29/19 10:10 PM   Specimen: Nasal Mucosa; Nasopharyngeal  Result Value Ref Range Status   MRSA by PCR NEGATIVE NEGATIVE Final    Comment:        The GeneXpert MRSA Assay (FDA approved for NASAL specimens only), is one component of a comprehensive MRSA colonization surveillance program. It is not intended to diagnose MRSA infection nor to guide or monitor treatment for MRSA infections. Performed at [facility], [address], [place], [place] [address]   Respiratory Panel by PCR     Status: None   Collection  Time: 11/30/19  4:45 PM   Specimen: Nasopharyngeal Swab; Respiratory  Result Value Ref Range Status   Adenovirus NOT DETECTED NOT DETECTED Final   Coronavirus 229E NOT DETECTED NOT DETECTED Final    Comment: (NOTE) The Coronavirus on the Respiratory Panel, DOES NOT test for the novel  Coronavirus (2019 nCoV)    Coronavirus HKU1 NOT DETECTED NOT DETECTED Final   Coronavirus NL63 NOT DETECTED NOT DETECTED Final   Coronavirus OC43 NOT DETECTED NOT DETECTED Final   Metapneumovirus NOT DETECTED NOT DETECTED Final   Rhinovirus / Enterovirus NOT DETECTED NOT DETECTED Final   Influenza A NOT DETECTED NOT DETECTED Final   Influenza B NOT DETECTED NOT DETECTED Final   Parainfluenza Virus 1 NOT DETECTED NOT DETECTED Final   Parainfluenza Virus 2 NOT DETECTED NOT DETECTED Final   Parainfluenza Virus 3 NOT DETECTED NOT DETECTED Final   Parainfluenza Virus 4 NOT DETECTED NOT DETECTED Final   Respiratory Syncytial Virus NOT DETECTED NOT DETECTED Final   Bordetella pertussis NOT DETECTED NOT DETECTED Final   Chlamydophila pneumoniae NOT DETECTED NOT DETECTED Final   Mycoplasma pneumoniae NOT DETECTED NOT DETECTED Final    Comment: Performed at [facility], [place] [address] [place],  [address]     Labs: BNP (last 3 results) Recent Labs    11/29/19 1455  BNP 76.1   Basic Metabolic Panel: Recent Labs  Lab 11/29/19 1449 11/30/19 0616 12/01/19 0513  NA 136 132* 135  K 3.5 3.7 3.6  CL 106 100 104  CO2 21* 19* 23  GLUCOSE 134* 186* 167*  BUN 15 12 16   CREATININE 1.00 0.92 0.83  CALCIUM 9.1 8.9 8.7*   Liver Function Tests: Recent Labs  Lab 11/29/19 1449  AST 32  ALT 69*  ALKPHOS 57  BILITOT 0.9  PROT 7.8  ALBUMIN 4.4   No results for input(s): LIPASE, AMYLASE in the last 168 hours. No results for input(s): AMMONIA in the last 168 hours. CBC: Recent Labs  Lab 11/29/19 1449 11/30/19 0616 12/01/19 0513  WBC 9.6 13.9* 11.7*  NEUTROABS 8.8*  --   --   HGB 16.6  14.9 14.2  HCT 45.9 42.8 40.0  MCV 82.0 84.4 85.1  PLT 214 218 236   Cardiac Enzymes: No results for input(s): CKTOTAL, CKMB, CKMBINDEX, TROPONINI in the last 168 hours. BNP: Invalid input(s):  POCBNP CBG: No results for input(s): GLUCAP in the last 168 hours. D-Dimer No results for input(s): DDIMER in the last 72 hours. Hgb A1c No results for input(s): HGBA1C in the last 72 hours. Lipid Profile No results for input(s): CHOL, HDL, LDLCALC, TRIG, CHOLHDL, LDLDIRECT in the last 72 hours. Thyroid function studies No results for input(s): TSH, T4TOTAL, T3FREE, THYROIDAB in the last 72 hours.  Invalid input(s): FREET3 Anemia work up No results for input(s): VITAMINB12, FOLATE, FERRITIN, TIBC, IRON, RETICCTPCT in the last 72 hours. Urinalysis    Component Value Date/Time   COLORURINE YELLOW 11/29/2019 1419   APPEARANCEUR CLEAR 11/29/2019 1419   LABSPEC 1.045 (H) 11/29/2019 1419   PHURINE 5.0 11/29/2019 1419   GLUCOSEU NEGATIVE 11/29/2019 1419   HGBUR NEGATIVE 11/29/2019 1419   BILIRUBINUR NEGATIVE 11/29/2019 1419   KETONESUR NEGATIVE 11/29/2019 1419   PROTEINUR NEGATIVE 11/29/2019 1419   NITRITE NEGATIVE 11/29/2019 1419   LEUKOCYTESUR NEGATIVE 11/29/2019 1419   Sepsis Labs Invalid input(s): PROCALCITONIN,  WBC,  LACTICIDVEN Microbiology Recent Results (from the past 240 hour(s))  Urine culture     Status: None   Collection Time: 11/29/19  2:19 PM   Specimen: In/Out Cath Urine  Result Value Ref Range Status   Specimen Description   Final    IN/OUT CATH URINE Performed at [facility], [address] [place], [place] [address]    Special Requests   Final    NONE Performed at [facility], [address] [place], [place] [address]    Culture   Final    NO GROWTH Performed at [facility], [address] [place], [place] [address]    Report Status 12/01/2019 FINAL  Final  Blood Culture (routine x 2)     Status: None (Preliminary result)   Collection Time:  11/29/19  2:52 PM   Specimen: BLOOD RIGHT HAND  Result Value Ref Range Status   Specimen Description   Final    BLOOD RIGHT HAND BOTTLES DRAWN AEROBIC AND ANAEROBIC   Special Requests Blood Culture adequate volume  Final   Culture   Final    NO GROWTH 2 DAYS Performed at [facility], [address] [place], [place] [address]    Report Status PENDING  Incomplete  Blood Culture (routine x 2)     Status: None (Preliminary result)   Collection Time: 11/29/19  2:55 PM   Specimen: BLOOD LEFT HAND  Result Value Ref Range Status   Specimen Description   Final    BLOOD LEFT HAND BOTTLES DRAWN AEROBIC AND ANAEROBIC   Special Requests Blood Culture adequate volume  Final   Culture   Final    NO GROWTH 2 DAYS Performed at [facility], [address] [place], [place] [address]    Report Status PENDING  Incomplete  SARS Coronavirus 2 by RT PCR (hospital order, performed in [facility] hospital lab) Nasopharyngeal Nasopharyngeal Swab     Status: None   Collection Time: 11/29/19  3:41 PM   Specimen: Nasopharyngeal Swab  Result Value Ref Range Status   SARS Coronavirus 2 NEGATIVE NEGATIVE Final    Comment: (NOTE) SARS-CoV-2 target nucleic acids are NOT DETECTED.  The SARS-CoV-2 RNA is generally detectable in upper and lower respiratory specimens during the acute phase of infection. The lowest concentration of SARS-CoV-2 viral copies this assay can detect is 250 copies / mL. A negative result does not preclude SARS-CoV-2 infection and should not be used as the sole basis for treatment or  other patient management decisions.  A negative result may occur with improper specimen collection / handling, submission of specimen other than nasopharyngeal swab, presence of viral mutation(s) within the areas targeted by this assay, and inadequate number of viral copies (<250 copies / mL). A negative result must be combined with clinical observations, patient history, and epidemiological  information.  Fact Sheet for Patients:   BoilerBrush.com.cy  Fact Sheet for Healthcare Providers: https://pope.com/  This test is not yet approved or  cleared by the [place] FDA and has been authorized for detection and/or diagnosis of SARS-CoV-2 by FDA under an Emergency Use Authorization (EUA).  This EUA will remain in effect (meaning this test can be used) for the duration of the COVID-19 declaration under Section 564(b)(1) of the Act, 21 U.S.C. section 360bbb-3(b)(1), unless the authorization is terminated or revoked sooner.  Performed at [facility], [address], [place], [place] [address]   MRSA PCR Screening     Status: None   Collection Time: 11/29/19 10:10 PM   Specimen: Nasal Mucosa; Nasopharyngeal  Result Value Ref Range Status   MRSA by PCR NEGATIVE NEGATIVE Final    Comment:        The GeneXpert MRSA Assay (FDA approved for NASAL specimens only), is one component of a comprehensive MRSA colonization surveillance program. It is not intended to diagnose MRSA infection nor to guide or monitor treatment for MRSA infections. Performed at [facility], [address], [place], [place] [address]   Respiratory Panel by PCR     Status: None   Collection Time: 11/30/19  4:45 PM   Specimen: Nasopharyngeal Swab; Respiratory  Result Value Ref Range Status   Adenovirus NOT DETECTED NOT DETECTED Final   Coronavirus 229E NOT DETECTED NOT DETECTED Final    Comment: (NOTE) The Coronavirus on the Respiratory Panel, DOES NOT test for the novel  Coronavirus (2019 nCoV)    Coronavirus HKU1 NOT DETECTED NOT DETECTED Final   Coronavirus NL63 NOT DETECTED NOT DETECTED Final   Coronavirus OC43 NOT DETECTED NOT DETECTED Final   Metapneumovirus NOT DETECTED NOT DETECTED Final   Rhinovirus / Enterovirus NOT DETECTED NOT DETECTED Final   Influenza A NOT DETECTED NOT DETECTED Final   Influenza B NOT DETECTED NOT DETECTED Final    Parainfluenza Virus 1 NOT DETECTED NOT DETECTED Final   Parainfluenza Virus 2 NOT DETECTED NOT DETECTED Final   Parainfluenza Virus 3 NOT DETECTED NOT DETECTED Final   Parainfluenza Virus 4 NOT DETECTED NOT DETECTED Final   Respiratory Syncytial Virus NOT DETECTED NOT DETECTED Final   Bordetella pertussis NOT DETECTED NOT DETECTED Final   Chlamydophila pneumoniae NOT DETECTED NOT DETECTED Final   Mycoplasma pneumoniae NOT DETECTED NOT DETECTED Final    Comment: Performed at [facility], [address] [place], [place] [address]     Time coordinating discharge: 35 minutes  SIGNED:   Erick Blinks, DO [facility] 12/01/2019, 11:48 AM  If 7PM-7AM, please contact night-coverage [email or web address]

## 2019-12-03 LAB — BLOOD CULTURE ID PANEL (REFLEXED)

## 2019-12-04 LAB — CULTURE, BLOOD (ROUTINE X 2)
Culture: NO GROWTH
Special Requests: ADEQUATE

## 2019-12-05 LAB — CULTURE, BLOOD (ROUTINE X 2): Special Requests: ADEQUATE

## 2020-01-09 ENCOUNTER — Other Ambulatory Visit: Payer: Self-pay

## 2020-01-09 ENCOUNTER — Emergency Department (HOSPITAL_COMMUNITY)
Admission: EM | Admit: 2020-01-09 | Discharge: 2020-01-10 | Disposition: A | Discharge status: Home / Self Care | Attending: Emergency Medicine | Admitting: Emergency Medicine

## 2020-01-09 ENCOUNTER — Encounter (HOSPITAL_COMMUNITY): Payer: Self-pay | Admitting: *Deleted

## 2020-01-09 DIAGNOSIS — Y92142 Bathroom in prison as the place of occurrence of the external cause: Secondary | ICD-10-CM | POA: Diagnosis not present

## 2020-01-09 DIAGNOSIS — I10 Essential (primary) hypertension: Secondary | ICD-10-CM | POA: Insufficient documentation

## 2020-01-09 DIAGNOSIS — Y9389 Activity, other specified: Secondary | ICD-10-CM | POA: Diagnosis not present

## 2020-01-09 DIAGNOSIS — Z79899 Other long term (current) drug therapy: Secondary | ICD-10-CM | POA: Diagnosis not present

## 2020-01-09 DIAGNOSIS — Y999 Unspecified external cause status: Secondary | ICD-10-CM | POA: Insufficient documentation

## 2020-01-09 DIAGNOSIS — J441 Chronic obstructive pulmonary disease with (acute) exacerbation: Secondary | ICD-10-CM | POA: Diagnosis not present

## 2020-01-09 DIAGNOSIS — W19XXXA Unspecified fall, initial encounter: Secondary | ICD-10-CM | POA: Diagnosis not present

## 2020-01-09 DIAGNOSIS — S0001XA Abrasion of scalp, initial encounter: Secondary | ICD-10-CM | POA: Diagnosis not present

## 2020-01-09 DIAGNOSIS — R55 Syncope and collapse: Secondary | ICD-10-CM

## 2020-01-09 MED ORDER — "THROMBI-PAD 3""X3"" EX PADS"
1.0000 | MEDICATED_PAD | Freq: Once | CUTANEOUS | Status: AC
  Administered 2020-01-10: 1 via TOPICAL
  Filled 2020-01-09: qty 1

## 2020-01-09 NOTE — ED Notes (Signed)
Pt refused blood work  

## 2020-01-09 NOTE — Discharge Instructions (Addendum)
Leave the thrombin pad in place until it wears off.  When it wears off the abrasion should be healed up enough that it will not bleed anymore.  Stop your aspirin for a few days.  Return to the emergency department for any problems listed on the head injury sheet or if you start feeling bad again.
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
## 2020-01-09 NOTE — ED Provider Notes (Addendum)
[facility] EMERGENCY DEPARTMENT Provider Note   CSN: [id number] Arrival date & time: 01/09/20  2113   Time seen 11:38 PM  History Chief Complaint  Patient presents with  . Fall    Lance Jacobson is a 44 y.o. male.  HPI   Patient presents from [facility] and states there is no air conditioning and he had been hot and sweaty all day.  Please note it was 93 today.  He states he was in the shower and got dizzy and he had a syncopal episode.  He complains of an abrasion to his scalp.  He denies any other injuries.  He does not want anything else done to evaluate him.  Patient states his last tetanus was a year ago.  States he feels fine now.  PCP Patient, No Pcp Per   Past Medical History:  Diagnosis Date  . Hypertension     Patient Active Problem List   Diagnosis Date Noted  . Acute respiratory failure with hypoxia (HCC) 11/29/2019  . COPD with acute exacerbation (HCC) 11/29/2019  . SIRS (systemic inflammatory response syndrome) (HCC) 11/29/2019    Past Surgical History:  Procedure Laterality Date  . APPENDECTOMY         No family history on file.  Social History   Tobacco Use  . Smoking status: Never Smoker  . Smokeless tobacco: Never Used  Substance Use Topics  . Alcohol use: Never  . Drug use: Not on file  Incarcerated  Home Medications Prior to Admission medications   Medication Sig Start Date End Date Taking? Authorizing Provider  albuterol (VENTOLIN HFA) 108 (90 Base) MCG/ACT inhaler Inhale 2 puffs into the lungs every 6 (six) hours as needed for wheezing or shortness of breath. 12/01/19   Maurilio Lovely D, DO  Patient states he only takes 1 aspirin 81 mg a day  Allergies    Patient has no known allergies.  Review of Systems   Review of Systems  All other systems reviewed and are negative.   Physical Exam Updated Vital Signs BP (!) 145/109   Pulse 86   Temp 98.3 F (36.8 C) (Oral)   Resp 20   Ht 5\' 5"  (1.651 m)   Wt (!) 95.3 kg   SpO2  96%   BMI 34.95 kg/m   Physical Exam Vitals and nursing note reviewed.  Constitutional:      General: He is not in acute distress.    Appearance: Normal appearance. He is obese.  HENT:     Head: Normocephalic.     Comments: Patient has a small 1/2 cm abrasion on his right scalp that does start oozing a lot of blood once there is no pressure on the wound.    Right Ear: External ear normal.     Left Ear: External ear normal.     Nose: Nose normal.  Eyes:     Extraocular Movements: Extraocular movements intact.     Conjunctiva/sclera: Conjunctivae normal.  Cardiovascular:     Rate and Rhythm: Normal rate.  Pulmonary:     Effort: Pulmonary effort is normal. No respiratory distress.  Musculoskeletal:        General: Normal range of motion.     Cervical back: Normal range of motion.  Skin:    General: Skin is warm and dry.  Neurological:     General: No focal deficit present.     Mental Status: He is alert and oriented to person, place, and time.  Cranial Nerves: No cranial nerve deficit.  Psychiatric:        Mood and Affect: Mood normal.        Behavior: Behavior normal.        Thought Content: Thought content normal.         ED Results / Procedures / Treatments   Labs (all labs ordered are listed, but only abnormal results are displayed) Labs Reviewed  BASIC METABOLIC PANEL  CBC WITH DIFFERENTIAL/PLATELET  TROPONIN I (HIGH SENSITIVITY)  TROPONIN I (HIGH SENSITIVITY)   Patient refused to have blood work drawn  EKG EKG Interpretation  Date/Time:  Friday January 09 2020 21:20:55 EDT Ventricular Rate:  86 PR Interval:  182 QRS Duration: 82 QT Interval:  342 QTC Calculation: 409 R Axis:   36 Text Interpretation: Normal sinus rhythm Inferior Q waves noted Abnormal ECG No significant change since last tracing Confirmed by Linwood Dibbles [phone]) on 01/09/2020 9:38:31 PM   Radiology No results found.  Procedures Procedures (including critical care  time)  Medications Ordered in ED Medications  Thrombi-Pad 3"X3" pad 1 each (has no administration in time range)    ED Course  I have reviewed the triage vital signs and the nursing notes.  Pertinent labs & imaging results that were available during my care of the patient were reviewed by me and considered in my medical decision making (see chart for details).    MDM Rules/Calculators/A&P                          8 thrombin pad was placed on his scalp, patient is on aspirin he has a very superficial abrasion that has a couple of small areas that are bleeding.  We discussed leaving it in place until it falls off at which point the abrasion should be healed enough that it will stop bleeding.  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    Final Clinical Impression(s) / ED Diagnoses Final diagnoses:  Abrasion of scalp, initial encounter  Syncope, unspecified syncope type    Rx / DC Orders ED Discharge Orders    None      Plan discharge  Devoria Albe, MD, Concha Pyo, MD 01/10/20 0000    Devoria Albe, MD 01/10/20 [phone]

## 2021-05-23 IMAGING — CT CT ABD-PELV W/ CM
2 of 5 series · 16 of 46 positions shown, 18 images · IV contrast (Omnipaque or Isovue)
Comparison: None.

CLINICAL DATA: Abdominal pain and fever.

EXAM:
CT ABDOMEN AND PELVIS WITH CONTRAST
TECHNIQUE: Multidetector CT imaging of the abdomen and pelvis was performed
using the standard protocol following bolus administration of
intravenous contrast.
CONTRAST:  100mL OMNIPAQUE IOHEXOL 350 MG/ML SOLN

[Series 4: abd/pel w/ · axial · 0.92mm/px · z∈[-531,-111]mm · 13 of 98 slices shown, 15 images]
[im 7/98  soft-tissue]
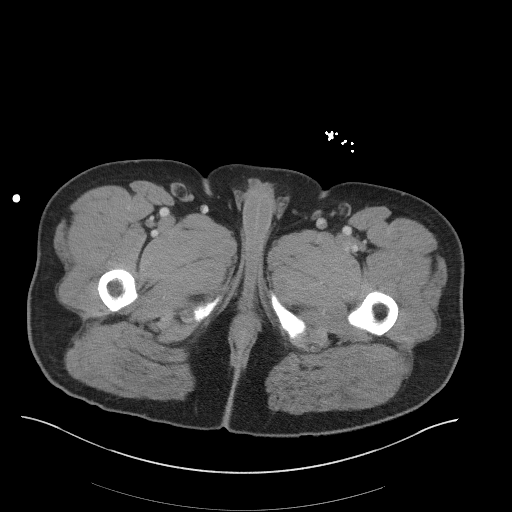
[im 7/98  bone]
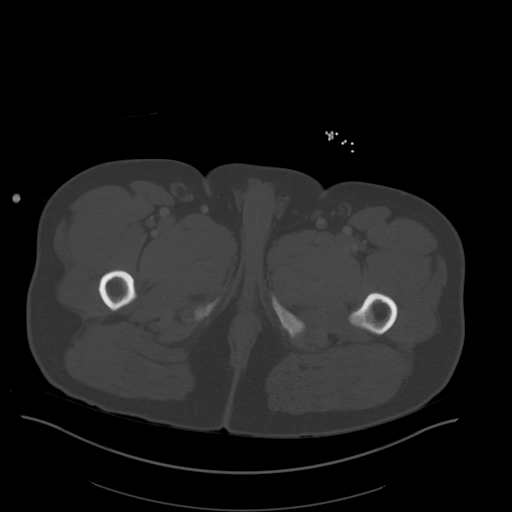
[im 13/98  soft-tissue]
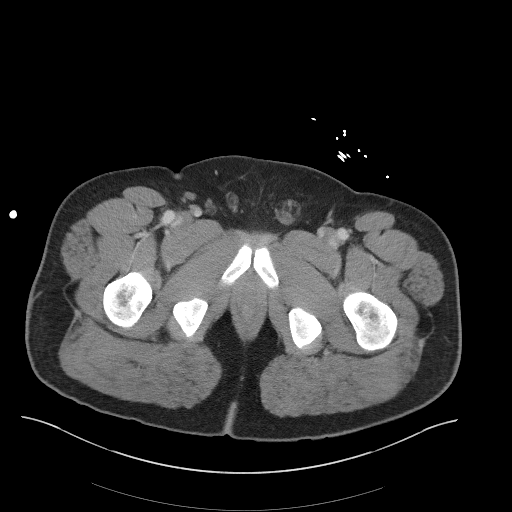
[im 19/98  soft-tissue]
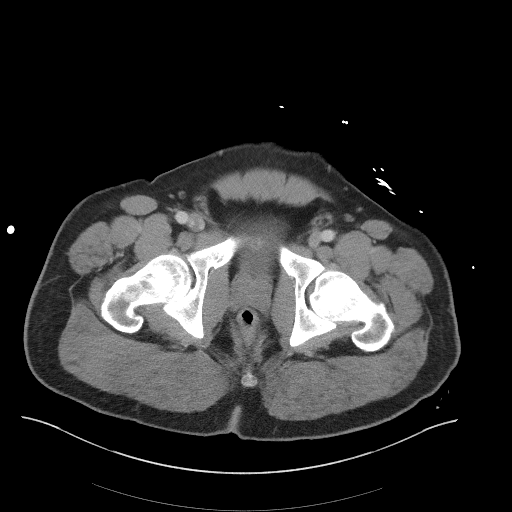
[im 31/98  soft-tissue]
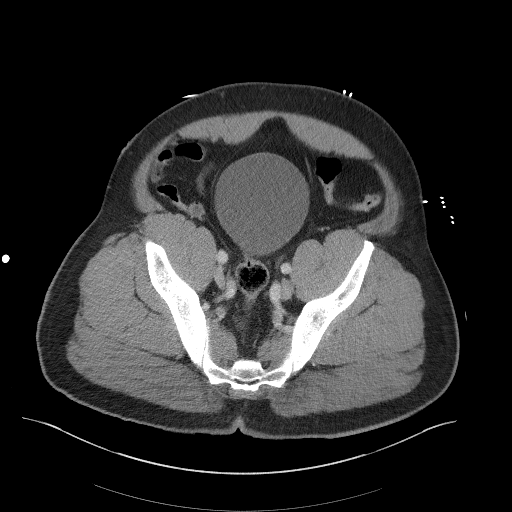
[im 37/98  soft-tissue]
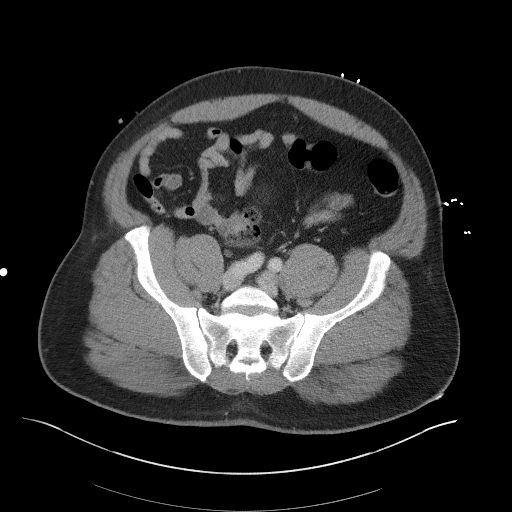
[im 43/98  soft-tissue]
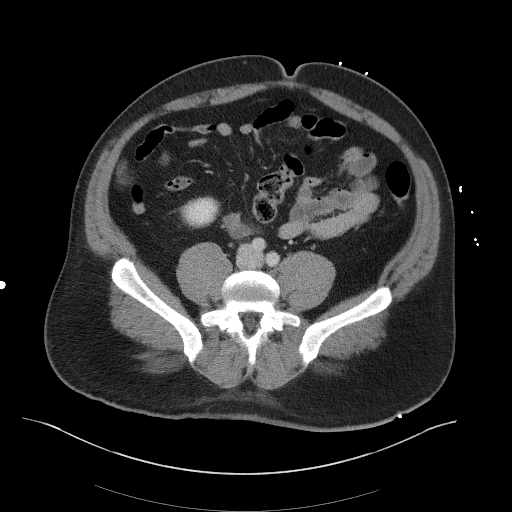
[im 49/98  soft-tissue]
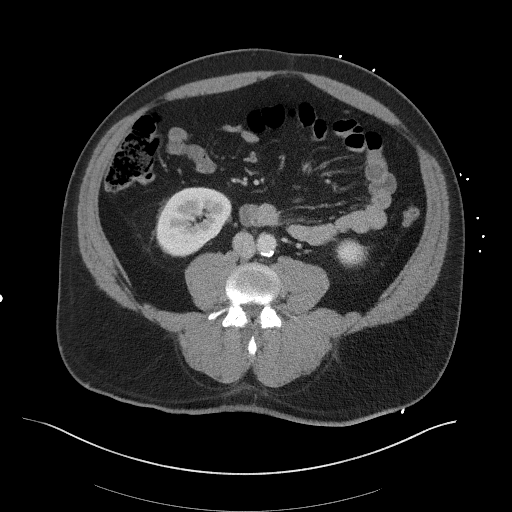
[im 55/98  soft-tissue]
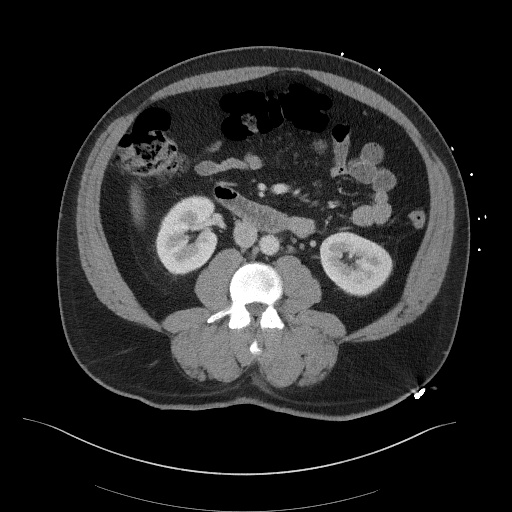
[im 61/98  soft-tissue]
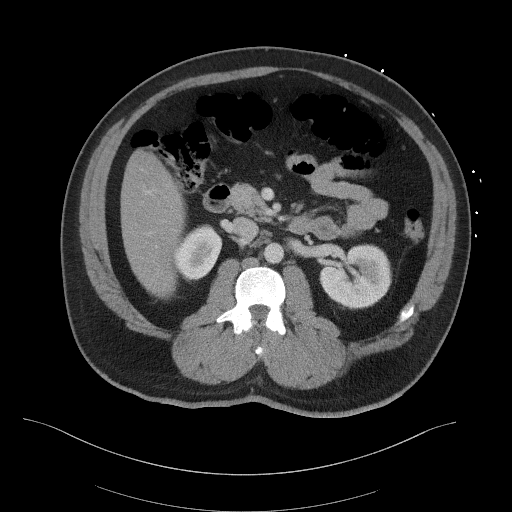
[im 61/98  bone]
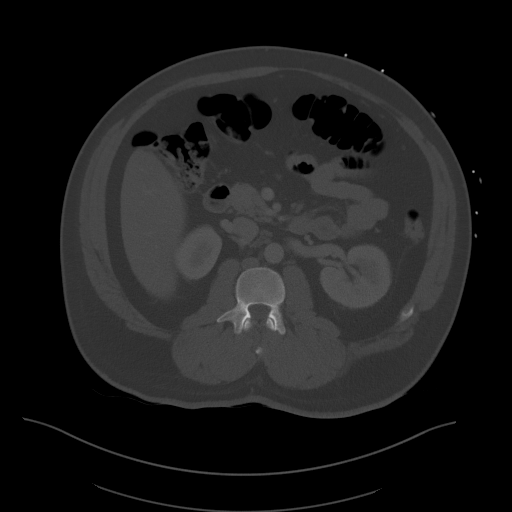
[im 67/98  soft-tissue]
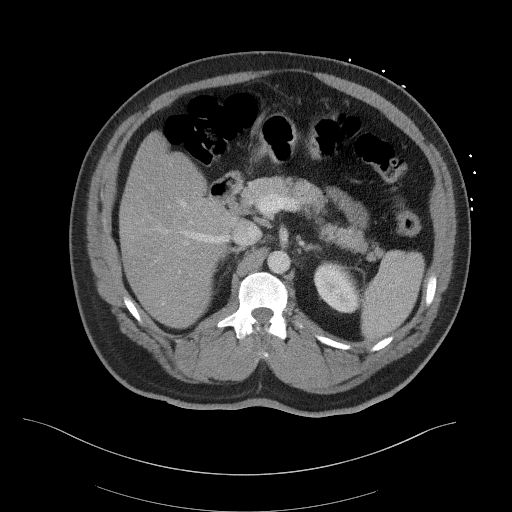
[im 79/98  soft-tissue]
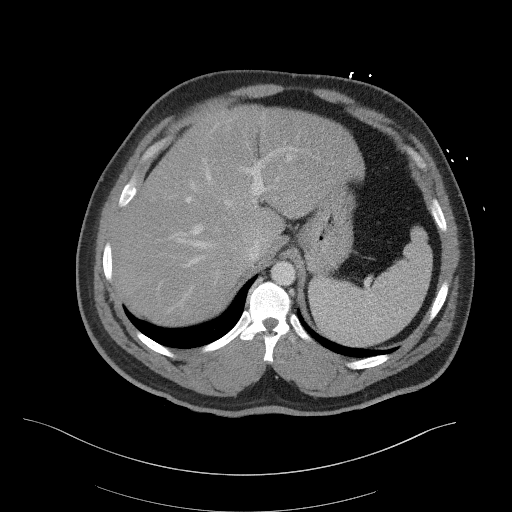
[im 85/98  soft-tissue]
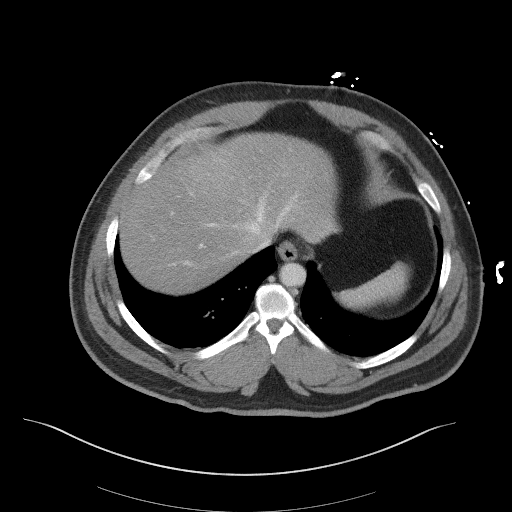
[im 91/98  soft-tissue]
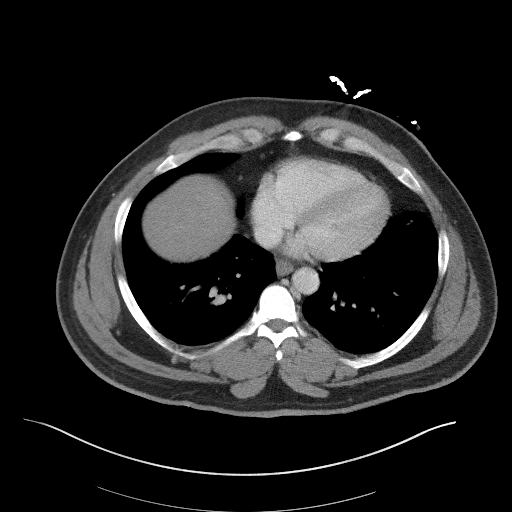

[Series 6: coronal · coronal · 0.86mm/px · 3 of 120 slices shown]
[im 40/120  soft-tissue]
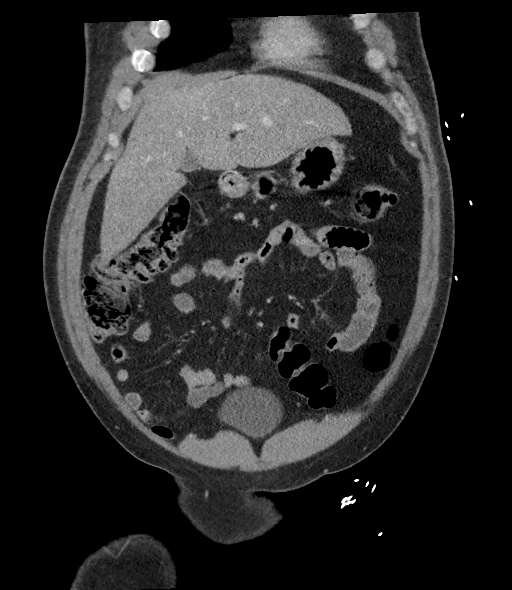
[im 53/120  soft-tissue]
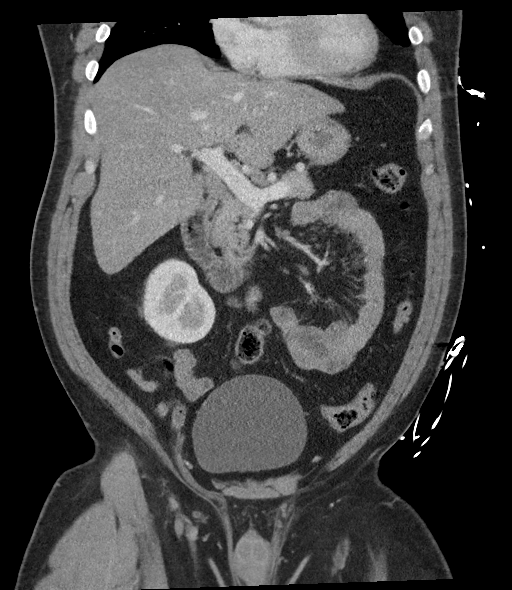
[im 67/120  soft-tissue]
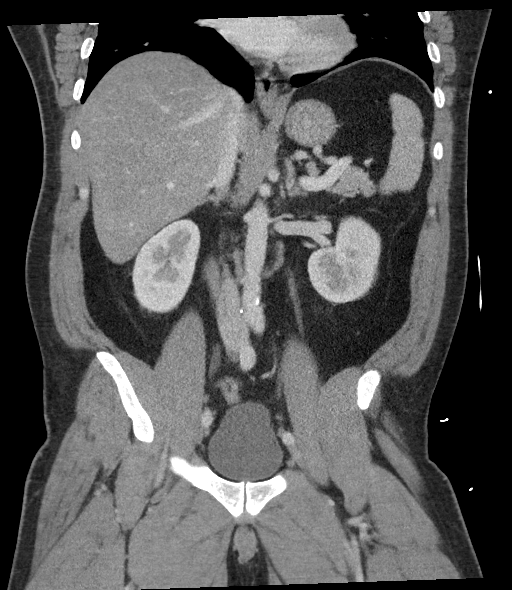

[16 of 46 positions shown; findings below may reference images not displayed]

FINDINGS: Lower chest: Normal.

Hepatobiliary: Hepatic steatosis. Biliary tree is normal including
the gallbladder.

Pancreas: Unremarkable. No pancreatic ductal dilatation or
surrounding inflammatory changes.

Spleen: Normal in size without focal abnormality.

Adrenals/Urinary Tract: Adrenal glands are unremarkable. Kidneys are
normal, without renal calculi, focal lesion, or hydronephrosis.
Bladder is unremarkable.

Stomach/Bowel: Stomach is within normal limits. Appendix has been
removed. No evidence of bowel wall thickening, distention, or
inflammatory changes.

Vascular/Lymphatic: No significant vascular findings are present. No
enlarged abdominal or pelvic lymph nodes.

Reproductive: Prostate is unremarkable.

Other: No abdominal wall hernia or abnormality. No abdominopelvic
ascites.

Musculoskeletal: No acute abnormality. Bilateral pars defects at L5.
No spondylolisthesis at this time. Otherwise normal.
IMPRESSION: 1. No acute abnormalities of the abdomen or pelvis.
2. Hepatic steatosis.
3. Bilateral pars defects at L5.
